# Patient Record
Sex: Male | Born: 1955 | Race: White | Hispanic: No | Marital: Married | Smoking: Current every day smoker
Health system: Southern US, Community
[De-identification: ages and names within clinical notes are randomized; demographics above are authoritative.]

## PROBLEM LIST (undated history)

## (undated) DIAGNOSIS — R918 Other nonspecific abnormal finding of lung field: Secondary | ICD-10-CM

## (undated) DIAGNOSIS — Z87442 Personal history of urinary calculi: Secondary | ICD-10-CM

## (undated) DIAGNOSIS — H269 Unspecified cataract: Secondary | ICD-10-CM

## (undated) HISTORY — DX: Other nonspecific abnormal finding of lung field: R91.8

## (undated) HISTORY — PX: WRIST SURGERY: SHX841

## (undated) HISTORY — DX: Unspecified cataract: H26.9

## (undated) HISTORY — PX: EYE SURGERY: SHX253

---

## 2021-01-08 ENCOUNTER — Ambulatory Visit (INDEPENDENT_AMBULATORY_CARE_PROVIDER_SITE_OTHER): Payer: Medicare Other | Admitting: Family Medicine

## 2021-01-08 ENCOUNTER — Other Ambulatory Visit: Payer: Self-pay

## 2021-01-08 ENCOUNTER — Encounter: Payer: Self-pay | Admitting: Family Medicine

## 2021-01-08 VITALS — BP 160/92 | HR 87 | Temp 99.1°F | Ht 68.0 in | Wt 176.0 lb

## 2021-01-08 DIAGNOSIS — R42 Dizziness and giddiness: Secondary | ICD-10-CM | POA: Diagnosis not present

## 2021-01-08 DIAGNOSIS — Z720 Tobacco use: Secondary | ICD-10-CM

## 2021-01-08 DIAGNOSIS — R911 Solitary pulmonary nodule: Secondary | ICD-10-CM | POA: Diagnosis not present

## 2021-01-08 DIAGNOSIS — I10 Essential (primary) hypertension: Secondary | ICD-10-CM | POA: Diagnosis not present

## 2021-01-08 DIAGNOSIS — N400 Enlarged prostate without lower urinary tract symptoms: Secondary | ICD-10-CM

## 2021-01-08 DIAGNOSIS — Z1211 Encounter for screening for malignant neoplasm of colon: Secondary | ICD-10-CM

## 2021-01-08 DIAGNOSIS — K219 Gastro-esophageal reflux disease without esophagitis: Secondary | ICD-10-CM

## 2021-01-08 DIAGNOSIS — K2289 Other specified disease of esophagus: Secondary | ICD-10-CM

## 2021-01-08 MED ORDER — OMEPRAZOLE 20 MG PO CPDR: 20.0000 mg | DELAYED_RELEASE_CAPSULE | Freq: Every day | ORAL | 3 refills | Status: DC

## 2021-01-08 MED ORDER — MECLIZINE HCL 25 MG PO TABS: 25.0000 mg | ORAL_TABLET | Freq: Three times a day (TID) | ORAL | 3 refills | Status: DC | PRN

## 2021-01-08 MED ORDER — LISINOPRIL 10 MG PO TABS: 10.0000 mg | ORAL_TABLET | Freq: Every day | ORAL | 3 refills | Status: DC

## 2021-01-08 NOTE — Progress Notes (Signed)
New Patient Office Visit  Assessment & Plan:  1. Essential hypertension Uncontrolled.  Started patient on lisinopril 10 mg once daily.  Education provided on the DASH diet. - lisinopril (ZESTRIL) 10 MG tablet; Take 1 tablet (10 mg total) by mouth daily.  Dispense: 30 tablet; Refill: 3 - CBC with Differential/Platelet - CMP14+EGFR - Lipid panel - TSH  2. Lung nodule Records requested from [facility] to review previous imaging that patient reports showed the lung nodule.  3. Tobacco use Encouraged smoking cessation. - CBC with Differential/Platelet - CMP14+EGFR - Lipid panel - CT CHEST LUNG CA SCREEN LOW DOSE W/O CM; Future  4. Dizziness Rx'd meclizine. - meclizine (ANTIVERT) 25 MG tablet; Take 1 tablet (25 mg total) by mouth 3 (three) times daily as needed for dizziness.  Dispense: 30 tablet; Refill: 3  5. Enlarged prostate - PR PSA, TOTAL SCREENING  6. Thickening of esophagus - Ambulatory referral to Gastroenterology  7. Gastroesophageal reflux disease, unspecified whether esophagitis present Started patient on Prilosec 20 mg once daily. - omeprazole (PRILOSEC) 20 MG capsule; Take 1 capsule (20 mg total) by mouth daily.  Dispense: 30 capsule; Refill: 3  8. Colon cancer screening - Ambulatory referral to Gastroenterology   Follow-up: Return in about 6 weeks (around 02/19/2021) for follow-up of chronic medication conditions.   [name], MSN, APRN, FNP-C [facility]  Subjective:  Patient ID: Richard Velez, male    DOB: 1956/09/15  Age: 65 y.o. MRN: [id number]  Patient Care Team: Loman Brooklyn, FNP as PCP - General (Family Medicine)  CC:  Chief Complaint  Patient presents with  . New Patient (Initial Visit)    No previous pcp  . Establish Care  . Ear Pain    Patient states that if he sleeping on his ear then he wakes up with ear pain.     HPI Richard Velez presents to establish care.   Patient is establishing care with  [place]. He reports he has not had a PCP and hasn't gone to the doctor in 30+ years. He is retired from [employer]. He is married.  His blood pressure today is elevated. He states he was told recently by another doctor that his blood pressure was elevated as well.   States he was told by someone at [facility] that he has pulmonary nodules; this was last year. He has had no follow-up of this.   He had a CT of the abdomen and pelvis on 10/04/2018 which revealed a kidney stone, enlarged prostate, and circumferential wall thickening of the distal esophagus. Consider endoscopy for further evaluation (reflux esophagitis, malignancy).  Patient does report he has acid reflux; both food and liquids get stuck in his throat at times.    Review of Systems  Constitutional: Negative for chills, fever, malaise/fatigue and weight loss.  HENT: Negative for congestion, ear discharge, ear pain, nosebleeds, sinus pain, sore throat and tinnitus.   Eyes: Negative for blurred vision, double vision, pain, discharge and redness.  Respiratory: Negative for cough, shortness of breath and wheezing.   Cardiovascular: Negative for chest pain, palpitations and leg swelling.  Gastrointestinal: Negative for abdominal pain, constipation, diarrhea, heartburn, nausea and vomiting.  Genitourinary: Negative for dysuria, frequency and urgency.  Musculoskeletal: Negative for myalgias.  Skin: Negative for rash.  Neurological: Positive for dizziness. Negative for seizures, weakness and headaches.  Psychiatric/Behavioral: Negative for depression, substance abuse and suicidal ideas. The patient is not nervous/anxious.     Current Outpatient Medications:  .  meclizine (ANTIVERT) 25 MG tablet, Take 25 mg by mouth 3 (three) times daily as needed for dizziness., Disp: , Rfl:   Allergies  Allergen Reactions  . Oxycodone-Acetaminophen Itching and Nausea Only    Past Medical History:  Diagnosis Date  . Cataract   . Lung nodules      Past Surgical History:  Procedure Laterality Date  . EYE SURGERY      Family History  Problem Relation Age of Onset  . Hypertension Mother   . Diabetes Mother   . Heart attack Father   . Diabetes Sister   . Diabetes Brother     Social History   Socioeconomic History  . Marital status: Married    Spouse name: Not on file  . Number of children: Not on file  . Years of education: Not on file  . Highest education level: Not on file  Occupational History  . Not on file  Tobacco Use  . Smoking status: Former Smoker    Packs/day: 1.00    Types: Cigarettes  . Smokeless tobacco: Never Used  Vaping Use  . Vaping Use: Never used  Substance and Sexual Activity  . Alcohol use: Yes    Comment: occ  . Drug use: Never  . Sexual activity: Not on file  Other Topics Concern  . Not on file  Social History Narrative  . Not on file   Social Determinants of Health   Financial Resource Strain: Not on file  Food Insecurity: Not on file  Transportation Needs: Not on file  Physical Activity: Not on file  Stress: Not on file  Social Connections: Not on file  Intimate Partner Violence: Not on file    Objective:   Today's Vitals: BP (!) 160/92   Pulse 87   Temp 99.1 F (37.3 C) (Temporal)   Ht $R'5\' 8"'Yq$  (1.727 m)   Wt 176 lb (79.8 kg)   SpO2 98%   BMI 26.76 kg/m   Physical Exam Vitals reviewed.  Constitutional:      General: He is not in acute distress.    Appearance: Normal appearance. He is not ill-appearing, toxic-appearing or diaphoretic.  HENT:     Head: Normocephalic and atraumatic.     Right Ear: Tympanic membrane, ear canal and external ear normal. There is no impacted cerumen.     Left Ear: Tympanic membrane, ear canal and external ear normal. There is no impacted cerumen.  Eyes:     General: No scleral icterus.       Right eye: No discharge.        Left eye: No discharge.     Conjunctiva/sclera: Conjunctivae normal.  Cardiovascular:     Rate and Rhythm:  Normal rate and regular rhythm.     Heart sounds: Normal heart sounds. No murmur heard. No friction rub. No gallop.   Pulmonary:     Effort: Pulmonary effort is normal. No respiratory distress.     Breath sounds: Normal breath sounds. No stridor. No wheezing, rhonchi or rales.  Musculoskeletal:        General: Normal range of motion.     Cervical back: Normal range of motion.  Skin:    General: Skin is warm and dry.  Neurological:     Mental Status: He is alert and oriented to person, place, and time. Mental status is at baseline.  Psychiatric:        Mood and Affect: Mood normal.        Behavior: Behavior normal.  Thought Content: Thought content normal.        Judgment: Judgment normal.

## 2021-01-08 NOTE — Patient Instructions (Signed)
DASH Eating Plan DASH stands for Dietary Approaches to Stop Hypertension. The DASH eating plan is a healthy eating plan that has been shown to:  Reduce high blood pressure (hypertension).  Reduce your risk for type 2 diabetes, heart disease, and stroke.  Help with weight loss. What are tips for following this plan? Reading food labels  Check food labels for the amount of salt (sodium) per serving. Choose foods with less than 5 percent of the Daily Value of sodium. Generally, foods with less than 300 milligrams (mg) of sodium per serving fit into this eating plan.  To find whole grains, look for the word "whole" as the first word in the ingredient list. Shopping  Buy products labeled as "low-sodium" or "no salt added."  Buy fresh foods. Avoid canned foods and pre-made or frozen meals. Cooking  Avoid adding salt when cooking. Use salt-free seasonings or herbs instead of table salt or sea salt. Check with your health care provider or pharmacist before using salt substitutes.  Do not fry foods. Cook foods using healthy methods such as baking, boiling, grilling, roasting, and broiling instead.  Cook with heart-healthy oils, such as olive, canola, avocado, soybean, or sunflower oil. Meal planning  Eat a balanced diet that includes: ? 4 or more servings of fruits and 4 or more servings of vegetables each day. Try to fill one-half of your plate with fruits and vegetables. ? 6-8 servings of whole grains each day. ? Less than 6 oz (170 g) of lean meat, poultry, or fish each day. A 3-oz (85-g) serving of meat is about the same size as a deck of cards. One egg equals 1 oz (28 g). ? 2-3 servings of low-fat dairy each day. One serving is 1 cup (237 mL). ? 1 serving of nuts, seeds, or beans 5 times each week. ? 2-3 servings of heart-healthy fats. Healthy fats called omega-3 fatty acids are found in foods such as walnuts, flaxseeds, fortified milks, and eggs. These fats are also found in  cold-water fish, such as sardines, salmon, and mackerel.  Limit how much you eat of: ? Canned or prepackaged foods. ? Food that is high in trans fat, such as some fried foods. ? Food that is high in saturated fat, such as fatty meat. ? Desserts and other sweets, sugary drinks, and other foods with added sugar. ? Full-fat dairy products.  Do not salt foods before eating.  Do not eat more than 4 egg yolks a week.  Try to eat at least 2 vegetarian meals a week.  Eat more home-cooked food and less restaurant, buffet, and fast food.   Lifestyle  When eating at a restaurant, ask that your food be prepared with less salt or no salt, if possible.  If you drink alcohol: ? Limit how much you use to:  0-1 drink a day for women who are not pregnant.  0-2 drinks a day for men. ? Be aware of how much alcohol is in your drink. In the [place] one drink equals one 12 oz bottle of beer (355 mL), one 5 oz glass of wine (148 mL), or one 1 oz glass of hard liquor (44 mL). General information  Avoid eating more than 2,300 mg of salt a day. If you have hypertension, you may need to reduce your sodium intake to 1,500 mg a day.  Work with your health care provider to maintain a healthy body weight or to lose weight. Ask what an ideal weight is for you.    Get at least 30 minutes of exercise that causes your heart to beat faster (aerobic exercise) most days of the week. Activities may include walking, swimming, or biking.  Work with your health care provider or dietitian to adjust your eating plan to your individual calorie needs. What foods should I eat? Fruits All fresh, dried, or frozen fruit. Canned fruit in natural juice (without added sugar). Vegetables Fresh or frozen vegetables (raw, steamed, roasted, or grilled). Low-sodium or reduced-sodium tomato and vegetable juice. Low-sodium or reduced-sodium tomato sauce and tomato paste. Low-sodium or reduced-sodium canned vegetables. Grains Whole-grain  or whole-wheat bread. Whole-grain or whole-wheat pasta. Brown rice. Oatmeal. Quinoa. Bulgur. Whole-grain and low-sodium cereals. Pita bread. Low-fat, low-sodium crackers. Whole-wheat flour tortillas. Meats and other proteins Skinless chicken or turkey. Ground chicken or turkey. Pork with fat trimmed off. Fish and seafood. Egg whites. Dried beans, peas, or lentils. Unsalted nuts, nut butters, and seeds. Unsalted canned beans. Lean cuts of beef with fat trimmed off. Low-sodium, lean precooked or cured meat, such as sausages or meat loaves. Dairy Low-fat (1%) or fat-free (skim) milk. Reduced-fat, low-fat, or fat-free cheeses. Nonfat, low-sodium ricotta or cottage cheese. Low-fat or nonfat yogurt. Low-fat, low-sodium cheese. Fats and oils Soft margarine without trans fats. Vegetable oil. Reduced-fat, low-fat, or light mayonnaise and salad dressings (reduced-sodium). Canola, safflower, olive, avocado, soybean, and sunflower oils. Avocado. Seasonings and condiments Herbs. Spices. Seasoning mixes without salt. Other foods Unsalted popcorn and pretzels. Fat-free sweets. The items listed above may not be a complete list of foods and beverages you can eat. Contact a dietitian for more information. What foods should I avoid? Fruits Canned fruit in a light or heavy syrup. Fried fruit. Fruit in cream or butter sauce. Vegetables Creamed or fried vegetables. Vegetables in a cheese sauce. Regular canned vegetables (not low-sodium or reduced-sodium). Regular canned tomato sauce and paste (not low-sodium or reduced-sodium). Regular tomato and vegetable juice (not low-sodium or reduced-sodium). Pickles. Olives. Grains Baked goods made with fat, such as croissants, muffins, or some breads. Dry pasta or rice meal packs. Meats and other proteins Fatty cuts of meat. Ribs. Fried meat. Bacon. Bologna, salami, and other precooked or cured meats, such as sausages or meat loaves. Fat from the back of a pig (fatback).  Bratwurst. Salted nuts and seeds. Canned beans with added salt. Canned or smoked fish. Whole eggs or egg yolks. Chicken or turkey with skin. Dairy Whole or 2% milk, cream, and half-and-half. Whole or full-fat cream cheese. Whole-fat or sweetened yogurt. Full-fat cheese. Nondairy creamers. Whipped toppings. Processed cheese and cheese spreads. Fats and oils Butter. Stick margarine. Lard. Shortening. Ghee. Bacon fat. Tropical oils, such as coconut, palm kernel, or palm oil. Seasonings and condiments Onion salt, garlic salt, seasoned salt, table salt, and sea salt. Worcestershire sauce. Tartar sauce. Barbecue sauce. Teriyaki sauce. Soy sauce, including reduced-sodium. Steak sauce. Canned and packaged gravies. Fish sauce. Oyster sauce. Cocktail sauce. Store-bought horseradish. Ketchup. Mustard. Meat flavorings and tenderizers. Bouillon cubes. Hot sauces. Pre-made or packaged marinades. Pre-made or packaged taco seasonings. Relishes. Regular salad dressings. Other foods Salted popcorn and pretzels. The items listed above may not be a complete list of foods and beverages you should avoid. Contact a dietitian for more information. Where to find more information  National Heart, Lung, and Blood Institute: www.nhlbi.nih.gov  American Heart Association: www.heart.org  Academy of Nutrition and Dietetics: www.eatright.org  National Kidney Foundation: www.kidney.org Summary  The DASH eating plan is a healthy eating plan that has been shown to reduce high   blood pressure (hypertension). It may also reduce your risk for type 2 diabetes, heart disease, and stroke.  When on the DASH eating plan, aim to eat more fresh fruits and vegetables, whole grains, lean proteins, low-fat dairy, and heart-healthy fats.  With the DASH eating plan, you should limit salt (sodium) intake to 2,300 mg a day. If you have hypertension, you may need to reduce your sodium intake to 1,500 mg a day.  Work with your health care  provider or dietitian to adjust your eating plan to your individual calorie needs. This information is not intended to replace advice given to you by your health care provider. Make sure you discuss any questions you have with your health care provider. Document Revised: 09/23/2019 Document Reviewed: 09/23/2019 Elsevier Patient Education  2021 Elsevier Inc.   

## 2021-01-09 LAB — CBC WITH DIFFERENTIAL/PLATELET
Basophils Absolute: 0.2 10*3/uL (ref 0.0–0.2)
Basos: 1 %
EOS (ABSOLUTE): 0.2 10*3/uL (ref 0.0–0.4)
Eos: 2 %
Hematocrit: 49.8 % (ref 37.5–51.0)
Hemoglobin: 17.6 g/dL (ref 13.0–17.7)
Immature Grans (Abs): 0 10*3/uL (ref 0.0–0.1)
Immature Granulocytes: 0 %
Lymphocytes Absolute: 2.9 10*3/uL (ref 0.7–3.1)
Lymphs: 25 %
MCH: 32.1 pg (ref 26.6–33.0)
MCHC: 35.3 g/dL (ref 31.5–35.7)
MCV: 91 fL (ref 79–97)
Monocytes Absolute: 0.9 10*3/uL (ref 0.1–0.9)
Monocytes: 8 %
Neutrophils Absolute: 7.4 10*3/uL — ABNORMAL HIGH (ref 1.4–7.0)
Neutrophils: 64 %
Platelets: 285 10*3/uL (ref 150–450)
RBC: 5.49 x10E6/uL (ref 4.14–5.80)
RDW: 12.5 % (ref 11.6–15.4)
WBC: 11.5 10*3/uL — ABNORMAL HIGH (ref 3.4–10.8)

## 2021-01-09 LAB — CMP14+EGFR
ALT: 36 IU/L (ref 0–44)
AST: 20 IU/L (ref 0–40)
Albumin/Globulin Ratio: 1.6 (ref 1.2–2.2)
Albumin: 4.2 g/dL (ref 3.8–4.8)
Alkaline Phosphatase: 140 IU/L — ABNORMAL HIGH (ref 44–121)
BUN/Creatinine Ratio: 20 (ref 10–24)
BUN: 19 mg/dL (ref 8–27)
Bilirubin Total: 0.4 mg/dL (ref 0.0–1.2)
CO2: 20 mmol/L (ref 20–29)
Calcium: 9.5 mg/dL (ref 8.6–10.2)
Chloride: 104 mmol/L (ref 96–106)
Creatinine, Ser: 0.94 mg/dL (ref 0.76–1.27)
Globulin, Total: 2.6 g/dL (ref 1.5–4.5)
Glucose: 114 mg/dL — ABNORMAL HIGH (ref 65–99)
Potassium: 4.3 mmol/L (ref 3.5–5.2)
Sodium: 140 mmol/L (ref 134–144)
Total Protein: 6.8 g/dL (ref 6.0–8.5)
eGFR: 90 mL/min/{1.73_m2} (ref >59)

## 2021-01-09 LAB — LIPID PANEL
Chol/HDL Ratio: 5.9 ratio — ABNORMAL HIGH (ref 0.0–5.0)
Cholesterol, Total: 217 mg/dL — ABNORMAL HIGH (ref 100–199)
HDL: 37 mg/dL — ABNORMAL LOW (ref >39)
LDL Chol Calc (NIH): 128 mg/dL — ABNORMAL HIGH (ref 0–99)
Triglycerides: 289 mg/dL — ABNORMAL HIGH (ref 0–149)
VLDL Cholesterol Cal: 52 mg/dL — ABNORMAL HIGH (ref 5–40)

## 2021-01-09 LAB — TSH: TSH: 1.34 u[IU]/mL (ref 0.450–4.500)

## 2021-01-10 ENCOUNTER — Encounter: Payer: Self-pay | Admitting: Family Medicine

## 2021-01-10 DIAGNOSIS — R1319 Other dysphagia: Secondary | ICD-10-CM | POA: Insufficient documentation

## 2021-01-10 DIAGNOSIS — Z720 Tobacco use: Secondary | ICD-10-CM | POA: Insufficient documentation

## 2021-01-10 DIAGNOSIS — N400 Enlarged prostate without lower urinary tract symptoms: Secondary | ICD-10-CM

## 2021-01-10 DIAGNOSIS — I10 Essential (primary) hypertension: Secondary | ICD-10-CM

## 2021-01-10 DIAGNOSIS — R911 Solitary pulmonary nodule: Secondary | ICD-10-CM | POA: Insufficient documentation

## 2021-01-10 DIAGNOSIS — R42 Dizziness and giddiness: Secondary | ICD-10-CM | POA: Insufficient documentation

## 2021-01-10 DIAGNOSIS — K219 Gastro-esophageal reflux disease without esophagitis: Secondary | ICD-10-CM

## 2021-01-10 DIAGNOSIS — K2289 Other specified disease of esophagus: Secondary | ICD-10-CM | POA: Insufficient documentation

## 2021-01-10 HISTORY — DX: Essential (primary) hypertension: I10

## 2021-01-10 HISTORY — DX: Benign prostatic hyperplasia without lower urinary tract symptoms: N40.0

## 2021-01-10 HISTORY — DX: Gastro-esophageal reflux disease without esophagitis: K21.9

## 2021-01-11 ENCOUNTER — Encounter (HOSPITAL_COMMUNITY): Payer: Self-pay

## 2021-01-11 ENCOUNTER — Encounter: Payer: Self-pay | Admitting: Family Medicine

## 2021-01-11 DIAGNOSIS — E782 Mixed hyperlipidemia: Secondary | ICD-10-CM

## 2021-01-11 HISTORY — DX: Mixed hyperlipidemia: E78.2

## 2021-01-11 NOTE — Progress Notes (Signed)
Received referral for initial lung cancer screening scan. Contacted patient and obtained smoking history (started age 65 smoking 1PPD, current smoker continuing to smoke 1PPD, 50 pack year) as well as answering questions related to the screening process. Patient denies signs/symptoms of lung cancer such as weight loss or hemoptysis. Patient denies comorbidity that would prevent curative treatment if lung cancer were to be found. Patient is scheduled for shared decision making visit on 01/17/2021 at 2:10pm

## 2021-01-15 ENCOUNTER — Encounter: Payer: Self-pay | Admitting: Nurse Practitioner

## 2021-01-15 ENCOUNTER — Telehealth: Payer: Self-pay

## 2021-01-15 ENCOUNTER — Other Ambulatory Visit: Payer: Self-pay

## 2021-01-15 ENCOUNTER — Ambulatory Visit (INDEPENDENT_AMBULATORY_CARE_PROVIDER_SITE_OTHER): Payer: Medicare Other | Admitting: Nurse Practitioner

## 2021-01-15 VITALS — BP 164/86 | HR 94 | Temp 98.0°F | Ht 68.0 in | Wt 174.2 lb

## 2021-01-15 DIAGNOSIS — R1310 Dysphagia, unspecified: Secondary | ICD-10-CM | POA: Insufficient documentation

## 2021-01-15 DIAGNOSIS — R1319 Other dysphagia: Secondary | ICD-10-CM

## 2021-01-15 DIAGNOSIS — R935 Abnormal findings on diagnostic imaging of other abdominal regions, including retroperitoneum: Secondary | ICD-10-CM | POA: Diagnosis not present

## 2021-01-15 DIAGNOSIS — Z0000 Encounter for general adult medical examination without abnormal findings: Secondary | ICD-10-CM | POA: Insufficient documentation

## 2021-01-15 DIAGNOSIS — K219 Gastro-esophageal reflux disease without esophagitis: Secondary | ICD-10-CM | POA: Diagnosis not present

## 2021-01-15 MED ORDER — CLENPIQ 10-3.5-12 MG-GM -GM/160ML PO SOLN: 1.0000 | Freq: Once | ORAL | 0 refills | Status: AC

## 2021-01-15 NOTE — Telephone Encounter (Signed)
PA for EGD/DIL submitted via [facility] website (no PA needed for TCS). PA# [id number], valid 02/27/21-05/28/21.

## 2021-01-15 NOTE — Patient Instructions (Addendum)
Your health issues we discussed today were:   GERD (reflux/heartburn) with dysphagia (swallowing difficulties): 1. Continue taking omeprazole (D acid blocker) once a day 2. We will plan for your upper endoscopy with possible dilation to evaluate the abnormal swallowing tube on your CT and to help with your swallowing problems 3. Further recommendations will follow your procedure  Need for colonoscopy: 1. We will complete your colonoscopy at the same time as your endoscopy 2. Further recommendations will follow your procedure  Elevated blood pressure: 1. Follow-up with primary care so they can make adjustments to your medications.  It is not unusual to have elevated blood pressure when you are just starting on blood pressure medications  Overall I recommend:  1. Continue your other current medications 2. Return for follow-up in 3 months 3. Call us for any questions or concerns   ---------------------------------------------------------------  At [facility] we value your feedback. You may receive a survey about your visit today. Please share your experience as we strive to create trusting relationships with our patients to provide genuine, compassionate, quality care.  We appreciate your understanding and patience as we review any laboratory studies, imaging, and other diagnostic tests that are ordered as we care for you. Our office policy is 5 business days for review of these results, and any emergent or urgent results are addressed in a timely manner for your best interest. If you do not hear from our office in 1 week, please contact us.   We also encourage the use of MyChart, which contains your medical information for your review as well. If you are not enrolled in this feature, an access code is on this after visit summary for your convenience. Thank you for allowing [place] to be involved in your care.  It was great to see you today!  I hope you have a great  spring!!    ---------------------------------------------------------------

## 2021-01-15 NOTE — Progress Notes (Signed)
Primary Care Physician:  Loman Brooklyn, FNP Primary Gastroenterologist:  Dr. Gala Romney  Chief Complaint  Patient presents with  . Consult    EGD for thickening of esophagus. C/o dysphagia with solid foods/liquids.  TCS done about 20 years ago    HPI:   Richard Velez is a 65 y.o. male who presents on referral from primary care for thickening of the esophagus and to schedule colonoscopy.  Reviewed information associated with the referral including office visit dated 01/08/2021 for multiple care concerns.  At that time noted lung nodule apparently from imaging at [facility] in the setting of tobacco use.  Recommend CT chest lung cancer screening low-dose.  Also noted thickening of the esophagus and GERD, currently on omeprazole 20 mg daily.  Also due for colon cancer screening.  Recommend referral to GI.  No history of colonoscopy or endoscopy was found in our system.  No chest or lung imaging available in our system.  No outside imaging records available in our system.  Reviewed CT of the abdomen and pelvis dated 10/04/2018.  Specifically noted circumferential wall thickening of the distal esophagus.  Also noted 4 mm nonobstructing right renal calculus without hydronephrosis.  Recommended endoscopy for further evaluation of these esophagus and to rule out malignancy.  Today he states he doing okay overall.  He has been having dysphagia with solid foods as well as liquids. Generally passes with time, rare to no regurgitation. Some mild GERD symptoms, on omeprazole which just started a week ago and not sure about effectiveness yet. Denies abdominal pain, N/V, hematochezia, melena, fever, chills, unintentional weight loss. Denies URI or flu-like symptoms. Denies loss of sense of taste or smell. The patient has not received COVID-19 vaccination(s). Denies chest pain, dyspnea, dizziness, lightheadedness, syncope, near syncope. Denies any other upper or lower GI symptoms.   He is also due for  colonoscopy as he states his last one was about 20 years ago in [place], [place] in an outpatient office, not sure of the name of the facility or endoscopist.  BP is a little elevated, just started lisinopril and will likely need titration.  Past Medical History:  Diagnosis Date  . Cataract   . Enlarged prostate 01/10/2021  . Essential hypertension 01/10/2021  . Gastroesophageal reflux disease 01/10/2021  . Lung nodules   . Mixed hyperlipidemia 01/11/2021    Past Surgical History:  Procedure Laterality Date  . EYE SURGERY      Current Outpatient Medications  Medication Sig Dispense Refill  . lisinopril (ZESTRIL) 10 MG tablet Take 1 tablet (10 mg total) by mouth daily. 30 tablet 3  . meclizine (ANTIVERT) 25 MG tablet Take 1 tablet (25 mg total) by mouth 3 (three) times daily as needed for dizziness. 30 tablet 3  . omeprazole (PRILOSEC) 20 MG capsule Take 1 capsule (20 mg total) by mouth daily. 30 capsule 3   No current facility-administered medications for this visit.    Allergies as of 01/15/2021 - Review Complete 01/15/2021  Allergen Reaction Noted  . Oxycodone-acetaminophen Itching and Nausea Only 12/17/2017    Family History  Problem Relation Age of Onset  . Hypertension Mother   . Diabetes Mother   . Heart attack Father   . Diabetes Sister   . Diabetes Brother   . Colon cancer Neg Hx   . Gastric cancer Neg Hx   . Esophageal cancer Neg Hx     Social History   Socioeconomic History  . Marital status: Married  Spouse name: Not on file  . Number of children: Not on file  . Years of education: Not on file  . Highest education level: Not on file  Occupational History  . Occupation: Retired    Comment: Delivered ice  Tobacco Use  . Smoking status: Current Every Day Smoker    Packs/day: 1.00    Types: Cigarettes    Start date: 01/11/1971  . Smokeless tobacco: Never Used  Vaping Use  . Vaping Use: Never used  Substance and Sexual Activity  . Alcohol use: Yes     Comment: occ  . Drug use: Never  . Sexual activity: Not on file  Other Topics Concern  . Not on file  Social History Narrative  . Not on file   Social Determinants of Health   Financial Resource Strain: Not on file  Food Insecurity: Not on file  Transportation Needs: Not on file  Physical Activity: Not on file  Stress: Not on file  Social Connections: Not on file  Intimate Partner Violence: Not on file    Subjective: Review of Systems  Constitutional: Negative for chills, fever, malaise/fatigue and weight loss.  HENT: Negative for congestion and sore throat.   Respiratory: Negative for cough and shortness of breath.   Cardiovascular: Negative for chest pain and palpitations.  Gastrointestinal: Positive for heartburn (just started PPI). Negative for abdominal pain, blood in stool, constipation, diarrhea, melena, nausea and vomiting.       Dysphagia  Musculoskeletal: Negative for joint pain and myalgias.  Skin: Negative for rash.  Neurological: Negative for dizziness and weakness.  Endo/Heme/Allergies: Does not bruise/bleed easily.  Psychiatric/Behavioral: Negative for depression. The patient is not nervous/anxious.   All other systems reviewed and are negative.      Objective: BP (!) 164/86   Pulse 94   Temp 98 F (36.7 C)   Ht 5\' 8"  (1.727 m)   Wt 174 lb 3.2 oz (79 kg)   BMI 26.49 kg/m  Physical Exam Vitals and nursing note reviewed.  Constitutional:      General: He is not in acute distress.    Appearance: Normal appearance. He is normal weight. He is not ill-appearing, toxic-appearing or diaphoretic.  HENT:     Head: Normocephalic and atraumatic.     Nose: No congestion or rhinorrhea.  Eyes:     General: No scleral icterus. Cardiovascular:     Rate and Rhythm: Normal rate and regular rhythm.     Heart sounds: Normal heart sounds.  Pulmonary:     Effort: Pulmonary effort is normal.     Breath sounds: Normal breath sounds.  Abdominal:     General:  Bowel sounds are normal. There is no distension.     Palpations: Abdomen is soft. There is no hepatomegaly, splenomegaly or mass.     Tenderness: There is no abdominal tenderness. There is no guarding or rebound.     Hernia: No hernia is present.  Musculoskeletal:     Cervical back: Neck supple.  Skin:    General: Skin is warm and dry.     Coloration: Skin is not jaundiced.     Findings: No bruising or rash.  Neurological:     General: No focal deficit present.     Mental Status: He is alert and oriented to person, place, and time. Mental status is at baseline.  Psychiatric:        Mood and Affect: Mood normal.        Behavior: Behavior normal.  Thought Content: Thought content normal.      Assessment:  Very pleasant 65 year old male presents on referral from primary care to schedule colonoscopy and due to previous CT imaging documented circumferential wall thickening of the distal esophagus.  Reviewed CT completed in 2019 in care everywhere, as outlined in HPI.  Recommended endoscopic evaluation.  He has had what seems to be longstanding GERD, to started on a PPI recently as he has just obtained insurance through [employer].  Could be simple untreated reflux esophagitis, although cannot rule out more insidious pathology.  We will plan for colonoscopy as well as EGD with possible dilation due to dysphagia symptoms.  His last colonoscopy was 20 years ago.  Further recommendations to follow.   Proceed with colonoscopy and EGD +/- dilation by Dr. Gala Romney in near future: the risks, benefits, and alternatives have been discussed with the patient in detail. The patient states understanding and desires to proceed.  ASA II  The patient is not on any other anticoagulants, anxiolytics, chronic pain medications, antidepressants, antidiabetics, or iron supplements.   Plan: 1. Continue current medications 2. Follow-up with primary care for blood pressure management 3. Colonoscopy and EGD as  outlined above 4. Follow-up in 3 months    Thank you for allowing [place] to participate in the care of Dorthula Perfect, DNP, AGNP-C Adult & Gerontological Nurse Practitioner [facility]   01/15/2021 2:24 PM   Disclaimer: This note was dictated with voice recognition software. Similar sounding words can inadvertently be transcribed and may not be corrected upon review.

## 2021-01-16 ENCOUNTER — Other Ambulatory Visit: Payer: Self-pay | Admitting: Family Medicine

## 2021-01-16 DIAGNOSIS — E782 Mixed hyperlipidemia: Secondary | ICD-10-CM

## 2021-01-16 MED ORDER — ATORVASTATIN CALCIUM 10 MG PO TABS: 10.0000 mg | ORAL_TABLET | Freq: Every day | ORAL | 2 refills | Status: DC

## 2021-01-17 ENCOUNTER — Other Ambulatory Visit: Payer: Self-pay

## 2021-01-17 ENCOUNTER — Inpatient Hospital Stay (HOSPITAL_COMMUNITY): Payer: Medicare Other | Attending: Oncology | Admitting: Oncology

## 2021-01-17 DIAGNOSIS — F17210 Nicotine dependence, cigarettes, uncomplicated: Secondary | ICD-10-CM

## 2021-01-17 DIAGNOSIS — Z87891 Personal history of nicotine dependence: Secondary | ICD-10-CM

## 2021-01-17 NOTE — Progress Notes (Signed)
Virtual Visit via Video Note  I connected with Richard Velez on 01/17/21 at  2:10 PM EDT by a video enabled telemedicine application and verified that I am speaking with the correct person using two identifiers.  Location: Patient: Home Provider: Clinic    I discussed the limitations of evaluation and management by telemedicine and the availability of in person appointments. The patient expressed understanding and agreed to proceed.  I discussed the assessment and treatment plan with the patient. The patient was provided an opportunity to ask questions and all were answered. The patient agreed with the plan and demonstrated an understanding of the instructions.   The patient was advised to call back or seek an in-person evaluation if the symptoms worsen or if the condition fails to improve as anticipated.   In accordance with CMS guidelines, patient has met eligibility criteria including age, absence of signs or symptoms of lung cancer.  Social History   Tobacco Use  . Smoking status: Current Every Day Smoker    Packs/day: 1.00    Types: Cigarettes    Start date: 01/11/1971  . Smokeless tobacco: Never Used  Vaping Use  . Vaping Use: Never used  Substance Use Topics  . Alcohol use: Yes    Comment: occ  . Drug use: Never      A shared decision-making session was conducted prior to the performance of CT scan. This includes one or more decision aids, includes benefits and harms of screening, follow-up diagnostic testing, over-diagnosis, false positive rate, and total radiation exposure.   Counseling on the importance of adherence to annual lung cancer LDCT screening, impact of co-morbidities, and ability or willingness to undergo diagnosis and treatment is imperative for compliance of the program.   Counseling on the importance of continued smoking cessation for former smokers; the importance of smoking cessation for current smokers, and information about tobacco cessation interventions  have been given to patient including [place] [facility] and 1800 quit [place] programs.   Written order for lung cancer screening with LDCT has been given to the patient and any and all questions have been answered to the best of my abilities.    Yearly follow up will be coordinated by Shawn Perkins, Thoracic Navigator.  I provided 15 minutes of non face-to-face telephone visit time during this encounter, and > 50% was spent counseling as documented under my assessment & plan.   Jennifer E Burns, NP  

## 2021-01-21 ENCOUNTER — Other Ambulatory Visit (HOSPITAL_COMMUNITY): Payer: Self-pay

## 2021-01-21 DIAGNOSIS — Z87891 Personal history of nicotine dependence: Secondary | ICD-10-CM

## 2021-01-21 DIAGNOSIS — Z122 Encounter for screening for malignant neoplasm of respiratory organs: Secondary | ICD-10-CM

## 2021-01-21 NOTE — Progress Notes (Signed)
Patient scheduled for LDCT on 02/19/2021 at 2p

## 2021-02-11 ENCOUNTER — Encounter: Payer: Self-pay | Admitting: Internal Medicine

## 2021-02-19 ENCOUNTER — Ambulatory Visit (HOSPITAL_COMMUNITY)
Admission: RE | Admit: 2021-02-19 | Discharge: 2021-02-19 | Disposition: A | Discharge status: Home / Self Care | Payer: Medicare Other | Source: Ambulatory Visit | Attending: Oncology | Admitting: Oncology

## 2021-02-19 ENCOUNTER — Other Ambulatory Visit: Payer: Self-pay

## 2021-02-19 DIAGNOSIS — Z122 Encounter for screening for malignant neoplasm of respiratory organs: Secondary | ICD-10-CM | POA: Diagnosis present

## 2021-02-19 DIAGNOSIS — I700 Atherosclerosis of aorta: Secondary | ICD-10-CM

## 2021-02-19 DIAGNOSIS — J449 Chronic obstructive pulmonary disease, unspecified: Secondary | ICD-10-CM | POA: Insufficient documentation

## 2021-02-19 DIAGNOSIS — Z87891 Personal history of nicotine dependence: Secondary | ICD-10-CM

## 2021-02-19 DIAGNOSIS — J439 Emphysema, unspecified: Secondary | ICD-10-CM

## 2021-02-19 HISTORY — DX: Atherosclerosis of aorta: I70.0

## 2021-02-19 HISTORY — DX: Emphysema, unspecified: J43.9

## 2021-02-21 ENCOUNTER — Ambulatory Visit (INDEPENDENT_AMBULATORY_CARE_PROVIDER_SITE_OTHER): Payer: Medicare Other | Admitting: Family Medicine

## 2021-02-21 ENCOUNTER — Other Ambulatory Visit: Payer: Self-pay

## 2021-02-21 ENCOUNTER — Encounter: Payer: Self-pay | Admitting: Family Medicine

## 2021-02-21 VITALS — BP 143/77 | HR 83 | Ht 68.0 in | Wt 174.0 lb

## 2021-02-21 DIAGNOSIS — K130 Diseases of lips: Secondary | ICD-10-CM

## 2021-02-21 DIAGNOSIS — I700 Atherosclerosis of aorta: Secondary | ICD-10-CM

## 2021-02-21 DIAGNOSIS — Z23 Encounter for immunization: Secondary | ICD-10-CM

## 2021-02-21 DIAGNOSIS — J439 Emphysema, unspecified: Secondary | ICD-10-CM | POA: Diagnosis not present

## 2021-02-21 DIAGNOSIS — I10 Essential (primary) hypertension: Secondary | ICD-10-CM | POA: Diagnosis not present

## 2021-02-21 DIAGNOSIS — R911 Solitary pulmonary nodule: Secondary | ICD-10-CM

## 2021-02-21 MED ORDER — COZAAR 50 MG PO TABS: 50.0000 mg | ORAL_TABLET | Freq: Every day | ORAL | 2 refills | Status: DC

## 2021-02-21 MED ORDER — ALBUTEROL SULFATE HFA 108 (90 BASE) MCG/ACT IN AERS: 2.0000 | INHALATION_SPRAY | Freq: Four times a day (QID) | RESPIRATORY_TRACT | 2 refills | Status: DC | PRN

## 2021-02-21 NOTE — Progress Notes (Signed)
Assessment & Plan:  1. Essential hypertension Well controlled on current regimen, but patient developed a cough after starting the lisinopril.  It was discontinued and added to his allergy list.  Rx'd losartan 50 mg once daily. - COZAAR 50 MG tablet; Take 1 tablet (50 mg total) by mouth daily.  Dispense: 30 tablet; Refill: 2  2. Pulmonary emphysema, unspecified emphysema type ([place]) New diagnosis.  Rx'd albuterol inhaler and advised patient he may use if he is feeling short of breath, tight in the chest, or wheezing. - albuterol (VENTOLIN HFA) 108 (90 Base) MCG/ACT inhaler; Inhale 2 puffs into the lungs every 6 (six) hours as needed.  Dispense: 18 g; Refill: 2  3. Aortic atherosclerosis ([place]) New diagnosis.  Patient is on a statin.  4. Lung nodule Benign on CT scan.  Continue yearly low-dose lung cancer screening CTs.  5. Lip lesion Offered a referral to dermatology, but patient does not wish to do the that at this time.  6. Need for Tdap vaccination - Tdap vaccine greater than or equal to 7yo IM - given in office.    Return in about 6 weeks (around 04/04/2021) for HTN.  [name], MSN, APRN, FNP-C [facility]  Subjective:    Patient ID: Richard Velez, male    DOB: 05-11-1956, 65 y.o.   MRN: [id number]  Patient Care Team: Loman Brooklyn, FNP as PCP - General (Family Medicine) Gala Romney Cristopher Estimable, MD as Consulting Physician (Gastroenterology)   Chief Complaint:  Chief Complaint  Patient presents with  . Hypertension    HPI: Richard Velez is a 65 y.o. male presenting on 02/21/2021 for Hypertension  Patient is following up on hypertension.  He was started on lisinopril at our last visit.  He reports it is causing him to have a dry hacking cough.  Patient is scheduled to have a colonoscopy on 02/27/2021.  Patient had his first low-dose lung cancer screening CT on 02/19/2021.  He does have a few small lung nodules that are benign in appearance.  The  scan also identified emphysema and aortic atherosclerosis.  Recommended to repeat in 12 months.  New complaints: Patient reports a spot on his lower lip.  He states he can scratch it off but that it will always come back.  Social history:  Relevant past medical, surgical, family and social history reviewed and updated as indicated. Interim medical history since our last visit reviewed.  Allergies and medications reviewed and updated.  DATA REVIEWED: CHART IN EPIC  ROS: Negative unless specifically indicated above in HPI.    Current Outpatient Medications:  .  atorvastatin (LIPITOR) 10 MG tablet, Take 1 tablet (10 mg total) by mouth daily., Disp: 30 tablet, Rfl: 2 .  lisinopril (ZESTRIL) 10 MG tablet, Take 1 tablet (10 mg total) by mouth daily., Disp: 30 tablet, Rfl: 3 .  meclizine (ANTIVERT) 25 MG tablet, Take 1 tablet (25 mg total) by mouth 3 (three) times daily as needed for dizziness., Disp: 30 tablet, Rfl: 3 .  omeprazole (PRILOSEC) 20 MG capsule, Take 1 capsule (20 mg total) by mouth daily., Disp: 30 capsule, Rfl: 3   Allergies  Allergen Reactions  . Oxycodone-Acetaminophen Itching and Nausea Only   Past Medical History:  Diagnosis Date  . Cataract   . Enlarged prostate 01/10/2021  . Essential hypertension 01/10/2021  . Gastroesophageal reflux disease 01/10/2021  . Lung nodules   . Mixed hyperlipidemia 01/11/2021    Past Surgical History:  Procedure Laterality Date  .  EYE SURGERY      Social History   Socioeconomic History  . Marital status: Married    Spouse name: Not on file  . Number of children: Not on file  . Years of education: Not on file  . Highest education level: Not on file  Occupational History  . Occupation: Retired    Comment: Delivered ice  Tobacco Use  . Smoking status: Current Every Day Smoker    Packs/day: 1.00    Types: Cigarettes    Start date: 01/11/1971  . Smokeless tobacco: Never Used  Vaping Use  . Vaping Use: Never used  Substance  and Sexual Activity  . Alcohol use: Yes    Comment: occ  . Drug use: Never  . Sexual activity: Not on file  Other Topics Concern  . Not on file  Social History Narrative  . Not on file   Social Determinants of Health   Financial Resource Strain: Not on file  Food Insecurity: Not on file  Transportation Needs: Not on file  Physical Activity: Not on file  Stress: Not on file  Social Connections: Not on file  Intimate Partner Violence: Not on file        Objective:    BP (!) 143/77   Pulse 83   Ht 5\' 8"  (1.727 m)   Wt 174 lb (78.9 kg)   SpO2 97%   BMI 26.46 kg/m   Wt Readings from Last 3 Encounters:  02/21/21 174 lb (78.9 kg)  01/15/21 174 lb 3.2 oz (79 kg)  01/08/21 176 lb (79.8 kg)    Physical Exam Vitals reviewed.  Constitutional:      General: He is not in acute distress.    Appearance: Normal appearance. He is not ill-appearing, toxic-appearing or diaphoretic.  HENT:     Head: Normocephalic and atraumatic.     Mouth/Throat:     Lips: Lesions (white on lower lip) present.  Eyes:     General: No scleral icterus.       Right eye: No discharge.        Left eye: No discharge.     Conjunctiva/sclera: Conjunctivae normal.  Cardiovascular:     Rate and Rhythm: Normal rate and regular rhythm.     Heart sounds: Normal heart sounds. No murmur heard. No friction rub. No gallop.   Pulmonary:     Effort: Pulmonary effort is normal. No respiratory distress.     Breath sounds: Normal breath sounds. No stridor. No wheezing, rhonchi or rales.  Musculoskeletal:        General: Normal range of motion.     Cervical back: Normal range of motion.  Skin:    General: Skin is warm and dry.  Neurological:     Mental Status: He is alert and oriented to person, place, and time. Mental status is at baseline.  Psychiatric:        Mood and Affect: Mood normal.        Behavior: Behavior normal.        Thought Content: Thought content normal.        Judgment: Judgment normal.      Lab Results  Component Value Date   TSH 1.340 01/08/2021   Lab Results  Component Value Date   WBC 11.5 (H) 01/08/2021   HGB 17.6 01/08/2021   HCT 49.8 01/08/2021   MCV 91 01/08/2021   PLT 285 01/08/2021   Lab Results  Component Value Date   NA 140 01/08/2021   K  4.3 01/08/2021   CO2 20 01/08/2021   GLUCOSE 114 (H) 01/08/2021   BUN 19 01/08/2021   CREATININE 0.94 01/08/2021   BILITOT 0.4 01/08/2021   ALKPHOS 140 (H) 01/08/2021   AST 20 01/08/2021   ALT 36 01/08/2021   PROT 6.8 01/08/2021   ALBUMIN 4.2 01/08/2021   CALCIUM 9.5 01/08/2021   Lab Results  Component Value Date   CHOL 217 (H) 01/08/2021   Lab Results  Component Value Date   HDL 37 (L) 01/08/2021   Lab Results  Component Value Date   LDLCALC 128 (H) 01/08/2021   Lab Results  Component Value Date   TRIG 289 (H) 01/08/2021   Lab Results  Component Value Date   CHOLHDL 5.9 (H) 01/08/2021   No results found for: HGBA1C

## 2021-02-24 ENCOUNTER — Encounter: Payer: Self-pay | Admitting: Family Medicine

## 2021-02-24 DIAGNOSIS — K130 Diseases of lips: Secondary | ICD-10-CM | POA: Insufficient documentation

## 2021-02-25 ENCOUNTER — Other Ambulatory Visit: Payer: Self-pay

## 2021-02-25 ENCOUNTER — Other Ambulatory Visit (HOSPITAL_COMMUNITY)
Admission: RE | Admit: 2021-02-25 | Discharge: 2021-02-25 | Disposition: A | Discharge status: Home / Self Care | Payer: Medicare Other | Source: Ambulatory Visit | Attending: Internal Medicine | Admitting: Internal Medicine

## 2021-02-25 DIAGNOSIS — Z01812 Encounter for preprocedural laboratory examination: Secondary | ICD-10-CM | POA: Diagnosis present

## 2021-02-25 DIAGNOSIS — Z20822 Contact with and (suspected) exposure to covid-19: Secondary | ICD-10-CM | POA: Insufficient documentation

## 2021-02-25 LAB — SARS CORONAVIRUS 2 (TAT 6-24 HRS): SARS Coronavirus 2: NEGATIVE

## 2021-02-26 ENCOUNTER — Encounter (HOSPITAL_COMMUNITY): Payer: Self-pay

## 2021-02-26 NOTE — Progress Notes (Signed)
Patient notified of LDCT Lung Cancer Screening Results via mail with the recommendation to follow-up in 12 months. Patient's referring provider has been sent a copy of results. Results are as follows:  IMPRESSION: 1. Lung-RADS 2, benign appearance or behavior. Continue annual screening with low-dose chest CT without contrast in 12 months. 2. Emphysema and aortic atherosclerosis.

## 2021-02-27 ENCOUNTER — Encounter (HOSPITAL_COMMUNITY): Payer: Self-pay | Admitting: Internal Medicine

## 2021-02-27 ENCOUNTER — Encounter (HOSPITAL_COMMUNITY)
Admission: RE | Disposition: A | Discharge status: Home / Self Care | Payer: Self-pay | Source: Home / Self Care | Attending: Internal Medicine

## 2021-02-27 ENCOUNTER — Other Ambulatory Visit: Payer: Self-pay

## 2021-02-27 ENCOUNTER — Ambulatory Visit (HOSPITAL_COMMUNITY)
Admission: RE | Admit: 2021-02-27 | Discharge: 2021-02-27 | Disposition: A | Discharge status: Home / Self Care | Payer: Medicare Other | Attending: Internal Medicine | Admitting: Internal Medicine

## 2021-02-27 DIAGNOSIS — Z79899 Other long term (current) drug therapy: Secondary | ICD-10-CM | POA: Diagnosis not present

## 2021-02-27 DIAGNOSIS — Z1211 Encounter for screening for malignant neoplasm of colon: Secondary | ICD-10-CM

## 2021-02-27 DIAGNOSIS — K635 Polyp of colon: Secondary | ICD-10-CM

## 2021-02-27 DIAGNOSIS — F17210 Nicotine dependence, cigarettes, uncomplicated: Secondary | ICD-10-CM | POA: Diagnosis not present

## 2021-02-27 DIAGNOSIS — D120 Benign neoplasm of cecum: Secondary | ICD-10-CM | POA: Diagnosis not present

## 2021-02-27 DIAGNOSIS — K219 Gastro-esophageal reflux disease without esophagitis: Secondary | ICD-10-CM | POA: Insufficient documentation

## 2021-02-27 DIAGNOSIS — K2950 Unspecified chronic gastritis without bleeding: Secondary | ICD-10-CM | POA: Diagnosis not present

## 2021-02-27 DIAGNOSIS — K222 Esophageal obstruction: Secondary | ICD-10-CM | POA: Diagnosis not present

## 2021-02-27 DIAGNOSIS — K449 Diaphragmatic hernia without obstruction or gangrene: Secondary | ICD-10-CM | POA: Diagnosis not present

## 2021-02-27 DIAGNOSIS — Z885 Allergy status to narcotic agent status: Secondary | ICD-10-CM | POA: Insufficient documentation

## 2021-02-27 DIAGNOSIS — R933 Abnormal findings on diagnostic imaging of other parts of digestive tract: Secondary | ICD-10-CM | POA: Diagnosis not present

## 2021-02-27 DIAGNOSIS — R1310 Dysphagia, unspecified: Secondary | ICD-10-CM

## 2021-02-27 HISTORY — PX: MALONEY DILATION: SHX5535

## 2021-02-27 HISTORY — PX: ESOPHAGOGASTRODUODENOSCOPY: SHX5428

## 2021-02-27 HISTORY — DX: Personal history of urinary calculi: Z87.442

## 2021-02-27 HISTORY — PX: POLYPECTOMY: SHX5525

## 2021-02-27 HISTORY — PX: COLONOSCOPY: SHX5424

## 2021-02-27 HISTORY — PX: BIOPSY: SHX5522

## 2021-02-27 SURGERY — COLONOSCOPY
Anesthesia: Moderate Sedation

## 2021-02-27 MED ORDER — ONDANSETRON HCL 4 MG/2ML IJ SOLN
INTRAMUSCULAR | Status: AC
  Filled 2021-02-27: qty 2

## 2021-02-27 MED ORDER — MEPERIDINE HCL 100 MG/ML IJ SOLN
INTRAMUSCULAR | Status: DC | PRN
  Administered 2021-02-27: 15 mg via INTRAVENOUS
  Administered 2021-02-27: 25 mg via INTRAVENOUS
  Administered 2021-02-27: 10 mg via INTRAVENOUS

## 2021-02-27 MED ORDER — MIDAZOLAM HCL 5 MG/5ML IJ SOLN
INTRAMUSCULAR | Status: DC | PRN
  Administered 2021-02-27: 1 mg via INTRAVENOUS
  Administered 2021-02-27 (×2): 2 mg via INTRAVENOUS
  Administered 2021-02-27: 1 mg via INTRAVENOUS
  Administered 2021-02-27: 2 mg via INTRAVENOUS
  Administered 2021-02-27 (×2): 1 mg via INTRAVENOUS

## 2021-02-27 MED ORDER — LIDOCAINE VISCOUS HCL 2 % MT SOLN
OROMUCOSAL | Status: DC | PRN
  Administered 2021-02-27: 5 mL via OROMUCOSAL

## 2021-02-27 MED ORDER — STERILE WATER FOR IRRIGATION IR SOLN: Status: DC | PRN

## 2021-02-27 MED ORDER — MEPERIDINE HCL 50 MG/ML IJ SOLN
INTRAMUSCULAR | Status: AC
  Filled 2021-02-27: qty 1

## 2021-02-27 MED ORDER — LIDOCAINE VISCOUS HCL 2 % MT SOLN
OROMUCOSAL | Status: AC
  Filled 2021-02-27: qty 15

## 2021-02-27 MED ORDER — SODIUM CHLORIDE 0.9 % IV SOLN
INTRAVENOUS | Status: DC
  Administered 2021-02-27: 1000 mL via INTRAVENOUS

## 2021-02-27 MED ORDER — ONDANSETRON HCL 4 MG/2ML IJ SOLN
INTRAMUSCULAR | Status: DC | PRN
  Administered 2021-02-27: 4 mg via INTRAVENOUS

## 2021-02-27 MED ORDER — MIDAZOLAM HCL 5 MG/5ML IJ SOLN
INTRAMUSCULAR | Status: AC
  Filled 2021-02-27: qty 10

## 2021-02-27 NOTE — H&P (Signed)
@LOGO @   Primary Care Physician:  Loman Brooklyn, FNP Primary Gastroenterologist:  Dr. Gala Romney  Pre-Procedure History & Physical: HPI:  Richard Velez is a 65 y.o. male here for further evaluation of esophageal dysphagia in the setting of longstanding GERD.  Abnormal distal esophagus on CT screening chest CT. Only recently started on a PPI.  Overdue for average rescreening colonoscopy as well. Past Medical History:  Diagnosis Date  . Aortic atherosclerosis ([place]) 02/19/2021  . Cataract   . Emphysema lung ([place]) 02/19/2021  . Enlarged prostate 01/10/2021  . Essential hypertension 01/10/2021  . Gastroesophageal reflux disease 01/10/2021  . History of kidney stones   . Lung nodules    Benign in appearance on low-dose lung cancer screening CT on 02/19/2021  . Mixed hyperlipidemia 01/11/2021    Past Surgical History:  Procedure Laterality Date  . EYE SURGERY Bilateral    cataract    Prior to Admission medications   Medication Sig Start Date End Date Taking? Authorizing Provider  atorvastatin (LIPITOR) 10 MG tablet Take 1 tablet (10 mg total) by mouth daily. 01/16/21  Yes Loman Brooklyn, FNP  COZAAR 50 MG tablet Take 1 tablet (50 mg total) by mouth daily. 02/21/21  Yes [name] F, FNP  meclizine (ANTIVERT) 25 MG tablet Take 1 tablet (25 mg total) by mouth 3 (three) times daily as needed for dizziness. 01/08/21  Yes Loman Brooklyn, FNP  omeprazole (PRILOSEC) 20 MG capsule Take 1 capsule (20 mg total) by mouth daily. 01/08/21  Yes [name] F, FNP  albuterol (VENTOLIN HFA) 108 (90 Base) MCG/ACT inhaler Inhale 2 puffs into the lungs every 6 (six) hours as needed. 02/21/21   Loman Brooklyn, FNP    Allergies as of 01/15/2021 - Review Complete 01/15/2021  Allergen Reaction Noted  . Oxycodone-acetaminophen Itching and Nausea Only 12/17/2017    Family History  Problem Relation Age of Onset  . Hypertension Mother   . Diabetes Mother   . Heart attack Father   . Diabetes Sister   .  Diabetes Brother   . Colon cancer Neg Hx   . Gastric cancer Neg Hx   . Esophageal cancer Neg Hx     Social History   Socioeconomic History  . Marital status: Married    Spouse name: Not on file  . Number of children: Not on file  . Years of education: Not on file  . Highest education level: Not on file  Occupational History  . Occupation: Retired    Comment: Delivered ice  Tobacco Use  . Smoking status: Current Every Day Smoker    Packs/day: 1.00    Types: Cigarettes    Start date: 01/11/1971  . Smokeless tobacco: Never Used  Vaping Use  . Vaping Use: Never used  Substance and Sexual Activity  . Alcohol use: Yes    Comment: occ  . Drug use: Never  . Sexual activity: Not on file  Other Topics Concern  . Not on file  Social History Narrative  . Not on file   Social Determinants of Health   Financial Resource Strain: Not on file  Food Insecurity: Not on file  Transportation Needs: Not on file  Physical Activity: Not on file  Stress: Not on file  Social Connections: Not on file  Intimate Partner Violence: Not on file    Review of Systems: See HPI, otherwise negative ROS  Physical Exam: BP (!) 164/89   Pulse 94   Temp 98.2 F (36.8 C) (Oral)  Resp 15   Ht 5\' 8"  (1.727 m)   Wt 78.9 kg   SpO2 98%   BMI 26.46 kg/m  General:   Alert,  Well-developed, well-nourished, pleasant and cooperative in NAD Neck:  Supple; no masses or thyromegaly. No significant cervical adenopathy. Lungs:  Clear throughout to auscultation.   No wheezes, crackles, or rhonchi. No acute distress. Heart:  Regular rate and rhythm; no murmurs, clicks, rubs,  or gallops. Abdomen: Non-distended, normal bowel sounds.  Soft and nontender without appreciable mass or hepatosplenomegaly.  Pulses:  Normal pulses noted. Extremities:  Without clubbing or edema.  Impression/Plan: 65 year old gentleman with a longstanding GERD and esophageal dysphagia in setting of abnormal esophagus on  cross-sectional imaging.  Here for EGD with further evaluation.  Also here for a screening colonoscopy-average risk.  The risks, benefits, limitations, imponderables and alternatives regarding both EGD and colonoscopy have been reviewed with the patient. Questions have been answered. All parties agreeable.      Notice: This dictation was prepared with Dragon dictation along with smaller phrase technology. Any transcriptional errors that result from this process are unintentional and may not be corrected upon review.

## 2021-02-27 NOTE — Op Note (Signed)
[facility] Patient Name: Richard Velez Procedure Date: 02/27/2021 10:43 AM MRN: [id number] Date of Birth: 1956/03/25 Attending MD: Norvel Richards , MD CSN: [id number] Age: 64 Admit Type: Outpatient Procedure:                Upper GI endoscopy Indications:              Dysphagia, Abnormal CT of the GI tract Providers:                Norvel Richards, MD, Otis Peak B. Sharon Seller, RN,                            Raphael Gibney, Technician Referring MD:              Medicines:                Midazolam 6 mg IV, Meperidine 50 mg IV Complications:            No immediate complications. Estimated Blood Loss:     Estimated blood loss was minimal. Procedure:                Pre-Anesthesia Assessment:                           - Prior to the procedure, a History and Physical                            was performed, and patient medications and                            allergies were reviewed. The patient's tolerance of                            previous anesthesia was also reviewed. The risks                            and benefits of the procedure and the sedation                            options and risks were discussed with the patient.                            All questions were answered, and informed consent                            was obtained. Prior Anticoagulants: The patient has                            taken no previous anticoagulant or antiplatelet                            agents. ASA Grade Assessment: III - A patient with                            severe systemic disease. After reviewing the risks  and benefits, the patient was deemed in                            satisfactory condition to undergo the procedure.                           After obtaining informed consent, the endoscope was                            passed under direct vision. Throughout the                            procedure, the patient's blood pressure, pulse, and                             oxygen saturations were monitored continuously. The                            [id number]-[id number]) scope was introduced through the                            mouth, and advanced to the second part of duodenum.                            The upper GI endoscopy was accomplished without                            difficulty. The patient tolerated the procedure                            well. Scope In: 11:10:36 AM Scope Out: 11:22:30 AM Total Procedure Duration: 0 hours 11 minutes 54 seconds  Findings:      Critical narrowing of the GE junction. Friable mucosa circumferentially       within 2 cm of the GE junction. No obvious tumor. Some denuded appearing       mucosa. Did not see any Barrett's epithelium.      A medium-sized hiatal hernia was present. Scattered gastric erosions. No       ulcer or infiltrating process observed. Patent pylorus.      Bulbar erosions present; otherwise the first and second portion of the       duodenum appeared normal. The scope was withdrawn. Dilation was       performed with a Maloney dilator with moderate resistance at 54 Fr. The       dilation site was examined following endoscope reinsertion and showed       mild mucosal disruption. Estimated blood loss was minimal. Finally,       biopsies of the abnormal GE junction and gastric mucosa were taken       separately. Impression:               - Distal esophageal stenosis/stricture. Dilated and                            biopsied.                           -  Medium-sized hiatal hernia. Gastric and bulbar                            erosions?"status post gastric biopsy                           - . Moderate Sedation:      Moderate (conscious) sedation was administered by the endoscopy nurse       and supervised by the endoscopist. The following parameters were       monitored: oxygen saturation, heart rate, blood pressure, respiratory       rate, EKG, adequacy of pulmonary  ventilation, and response to care.       Total physician intraservice time was 23 minutes. Recommendation:           - Patient has a contact number available for                            emergencies. The signs and symptoms of potential                            delayed complications were discussed with the                            patient. Return to normal activities tomorrow.                            Written discharge instructions were provided to the                            patient.                           - Advance diet as tolerated.                           - Continue present medications. However, increase                            omeprazole to 40 mg twice daily i.e. before                            breakfast and supper. New prescription provided                            follow-up on pathology. See colonoscopy report.                           - Return to my office after studies are complete. Procedure Code(s):        --- Professional ---                           [phone], Esophagogastroduodenoscopy, flexible,                            transoral; diagnostic, including collection of  specimen(s) by brushing or washing, when performed                            (separate procedure)                           43450, Dilation of esophagus, by unguided sound or                            bougie, single or multiple passes                           99153, Moderate sedation; each additional 15                            minutes intraservice time                           G0500, Moderate sedation services provided by the                            same physician or other qualified health care                            professional performing a gastrointestinal                            endoscopic service that sedation supports,                            requiring the presence of an independent trained                            observer to assist in  the monitoring of the                            patient's level of consciousness and physiological                            status; initial 15 minutes of intra-service time;                            patient age 41 years or older (additional time may                            be reported with [id number], as appropriate) Diagnosis Code(s):        --- Professional ---                           K22.2, Esophageal obstruction                           K44.9, Diaphragmatic hernia without obstruction or  gangrene                           R13.10, Dysphagia, unspecified                           R93.3, Abnormal findings on diagnostic imaging of                            other parts of digestive tract CPT copyright 2019 American Medical Association. All rights reserved. The codes documented in this report are preliminary and upon coder review may  be revised to meet current compliance requirements. Cristopher Estimable. Toan Mort, MD Norvel Richards, MD 02/27/2021 12:00:24 PM This report has been signed electronically. Number of Addenda: 0

## 2021-02-27 NOTE — Discharge Instructions (Signed)
EGD Discharge instructions Please read the instructions outlined below and refer to this sheet in the next few weeks. These discharge instructions provide you with general information on caring for yourself after you leave the hospital. Your doctor may also give you specific instructions. While your treatment has been planned according to the most current medical practices available, unavoidable complications occasionally occur. If you have any problems or questions after discharge, please call your doctor. ACTIVITY  You may resume your regular activity but move at a slower pace for the next 24 hours.   Take frequent rest periods for the next 24 hours.   Walking will help expel (get rid of) the air and reduce the bloated feeling in your abdomen.   No driving for 24 hours (because of the anesthesia (medicine) used during the test).   You may shower.   Do not sign any important legal documents or operate any machinery for 24 hours (because of the anesthesia used during the test).  NUTRITION  Drink plenty of fluids.   You may resume your normal diet.   Begin with a light meal and progress to your normal diet.   Avoid alcoholic beverages for 24 hours or as instructed by your caregiver.  MEDICATIONS  You may resume your normal medications unless your caregiver tells you otherwise.  WHAT YOU CAN EXPECT TODAY  You may experience abdominal discomfort such as a feeling of fullness or "gas" pains.  FOLLOW-UP  Your doctor will discuss the results of your test with you.  SEEK IMMEDIATE MEDICAL ATTENTION IF ANY OF THE FOLLOWING OCCUR:  Excessive nausea (feeling sick to your stomach) and/or vomiting.   Severe abdominal pain and distention (swelling).   Trouble swallowing.   Temperature over 101 F (37.8 C).   Rectal bleeding or vomiting of blood.    You had a narrowing in your esophagus which was dilated.  Your stomach was inflamed.  Biopsies taken.  Increase omeprazole to 40 mg  twice daily-before breakfast and supper.  New prescription provided  2 polyps removed from your colon.  Further recommendations to follow pending review of pathology report  Office visit with [place] in 2 months  At patient request, I called Davonn Flanery at [phone] -reviewed results.     Colonoscopy, Adult, Care After This sheet gives you information about how to care for yourself after your procedure. Your doctor may also give you more specific instructions. If you have problems or questions, call your doctor. What can I expect after the procedure? After the procedure, it is common to have:  A small amount of blood in your poop (stool) for 24 hours.  Some gas.  Mild cramping or bloating in your belly (abdomen). Follow these instructions at home: Eating and drinking  Drink enough fluid to keep your pee (urine) pale yellow.  Follow instructions from your doctor about what you cannot eat or drink.  Return to your normal diet as told by your doctor. Avoid heavy or fried foods that are hard to digest.   Activity  Rest as told by your doctor.  Do not sit for a long time without moving. Get up to take short walks every 1-2 hours. This is important. Ask for help if you feel weak or unsteady.  Return to your normal activities as told by your doctor. Ask your doctor what activities are safe for you. To help cramping and bloating:  Try walking around.  Put heat on your belly as told by your doctor. Use the  heat source that your doctor recommends, such as a moist heat pack or a heating pad. ? Put a towel between your skin and the heat source. ? Leave the heat on for 20-30 minutes. ? Remove the heat if your skin turns bright red. This is very important if you are unable to feel pain, heat, or cold. You may have a greater risk of getting burned.   General instructions  If you were given a medicine to help you relax (sedative) during your procedure, it can affect you for many  hours. Do not drive or use machinery until your doctor says that it is safe.  For the first 24 hours after the procedure: ? Do not sign important documents. ? Do not drink alcohol. ? Do your daily activities more slowly than normal. ? Eat foods that are soft and easy to digest.  Take over-the-counter or prescription medicines only as told by your doctor.  Keep all follow-up visits as told by your doctor. This is important. Contact a doctor if:  You have blood in your poop 2-3 days after the procedure. Get help right away if:  You have more than a small amount of blood in your poop.  You see large clumps of tissue (blood clots) in your poop.  Your belly is swollen.  You feel like you may vomit (nauseous).  You vomit.  You have a fever.  You have belly pain that gets worse, and medicine does not help your pain. Summary  After the procedure, it is common to have a small amount of blood in your poop. You may also have mild cramping and bloating in your belly.  If you were given a medicine to help you relax (sedative) during your procedure, it can affect you for many hours. Do not drive or use machinery until your doctor says that it is safe.  Get help right away if you have a lot of blood in your poop, feel like you may vomit, have a fever, or have more belly pain. This information is not intended to replace advice given to you by your health care provider. Make sure you discuss any questions you have with your health care provider. Document Revised: 08/26/2019 Document Reviewed: 05/16/2019 Elsevier Patient Education  2021 Elsevier Inc.     Colon Polyps  Colon polyps are tissue growths inside the colon, which is part of the large intestine. They are one of the types of polyps that can grow in the body. A polyp may be a round bump or a mushroom-shaped growth. You could have one polyp or more than one. Most colon polyps are noncancerous (benign). However, some colon polyps  can become cancerous over time. Finding and removing the polyps early can help prevent this. What are the causes? The exact cause of colon polyps is not known. What increases the risk? The following factors may make you more likely to develop this condition:  Having a family history of colorectal cancer or colon polyps.  Being older than 66 years of age.  Being younger than 66 years of age and having a significant family history of colorectal cancer or colon polyps or a genetic condition that puts you at higher risk of getting colon polyps.  Having inflammatory bowel disease, such as ulcerative colitis or Crohn's disease.  Having certain conditions passed from parent to child (hereditary conditions), such as: ? Familial adenomatous polyposis (FAP). ? Lynch syndrome. ? Turcot syndrome. ? Peutz-Jeghers syndrome. ? MUTYH-associated polyposis (MAP).  Being overweight.  Certain lifestyle factors. These include smoking cigarettes, drinking too much alcohol, not getting enough exercise, and eating a diet that is high in fat and red meat and low in fiber.  Having had childhood cancer that was treated with radiation of the abdomen. What are the signs or symptoms? Many times, there are no symptoms. If you have symptoms, they may include:  Blood coming from the rectum during a bowel movement.  Blood in the stool (feces). The blood may be bright red or very dark in color.  Pain in the abdomen.  A change in bowel habits, such as constipation or diarrhea. How is this diagnosed? This condition is diagnosed with a colonoscopy. This is a procedure in which a lighted, flexible scope is inserted into the opening between the buttocks (anus) and then passed into the colon to examine the area. Polyps are sometimes found when a colonoscopy is done as part of routine cancer screening tests. How is this treated? This condition is treated by removing any polyps that are found. Most polyps can be  removed during a colonoscopy. Those polyps will then be tested for cancer. Additional treatment may be needed depending on the results of testing. Follow these instructions at home: Eating and drinking  Eat foods that are high in fiber, such as fruits, vegetables, and whole grains.  Eat foods that are high in calcium and vitamin D, such as milk, cheese, yogurt, eggs, liver, fish, and broccoli.  Limit foods that are high in fat, such as fried foods and desserts.  Limit the amount of red meat, precooked or cured meat, or other processed meat that you eat, such as hot dogs, sausages, bacon, or meat loaves.  Limit sugary drinks.   Lifestyle  Maintain a healthy weight, or lose weight if recommended by your health care provider.  Exercise every day or as told by your health care provider.  Do not use any products that contain nicotine or tobacco, such as cigarettes, e-cigarettes, and chewing tobacco. If you need help quitting, ask your health care provider.  Do not drink alcohol if: ? Your health care provider tells you not to drink. ? You are pregnant, may be pregnant, or are planning to become pregnant.  If you drink alcohol: ? Limit how much you use to:  0-1 drink a day for women.  0-2 drinks a day for men. ? Know how much alcohol is in your drink. In the [place] one drink equals one 12 oz bottle of beer (355 mL), one 5 oz glass of wine (148 mL), or one 1 oz glass of hard liquor (44 mL). General instructions  Take over-the-counter and prescription medicines only as told by your health care provider.  Keep all follow-up visits. This is important. This includes having regularly scheduled colonoscopies. Talk to your health care provider about when you need a colonoscopy. Contact a health care provider if:  You have new or worsening bleeding during a bowel movement.  You have new or increased blood in your stool.  You have a change in bowel habits.  You lose weight for no known  reason. Summary  Colon polyps are tissue growths inside the colon, which is part of the large intestine. They are one type of polyp that can grow in the body.  Most colon polyps are noncancerous (benign), but some can become cancerous over time.  This condition is diagnosed with a colonoscopy.  This condition is treated by removing any polyps that are found. Most polyps can  be removed during a colonoscopy. This information is not intended to replace advice given to you by your health care provider. Make sure you discuss any questions you have with your health care provider. Document Revised: 02/08/2020 Document Reviewed: 02/08/2020 Elsevier Patient Education  2021 [employer].

## 2021-02-27 NOTE — Op Note (Signed)
[facility] Patient Name: Richard Velez Procedure Date: 02/27/2021 11:26 AM MRN: [id number] Date of Birth: 1956-05-23 Attending MD: Norvel Richards , MD CSN: [id number] Age: 65 Admit Type: Outpatient Procedure:                Colonoscopy Indications:              Screening for colorectal malignant neoplasm Providers:                Norvel Richards, MD, Jeanann Lewandowsky. Sharon Seller, RN,                            Raphael Gibney, Technician Referring MD:              Medicines:                Midazolam 10 mg IV, Meperidine 50 mg IV Complications:            No immediate complications. Estimated Blood Loss:     Estimated blood loss: none. Procedure:                Pre-Anesthesia Assessment:                           - Prior to the procedure, a History and Physical                            was performed, and patient medications and                            allergies were reviewed. The patient's tolerance of                            previous anesthesia was also reviewed. The risks                            and benefits of the procedure and the sedation                            options and risks were discussed with the patient.                            All questions were answered, and informed consent                            was obtained. Prior Anticoagulants: The patient has                            taken no previous anticoagulant or antiplatelet                            agents. ASA Grade Assessment: III - A patient with                            severe systemic disease. After reviewing the risks  and benefits, the patient was deemed in                            satisfactory condition to undergo the procedure.                           After obtaining informed consent, the colonoscope                            was passed under direct vision. Throughout the                            procedure, the patient's blood pressure, pulse, and                             oxygen saturations were monitored continuously. The                            [id number] ([id number]) scope was introduced through                            the anus and advanced to the the cecum, identified                            by appendiceal orifice and ileocecal valve. The                            colonoscopy was performed without difficulty. The                            patient tolerated the procedure well. The quality                            of the bowel preparation was adequate. Scope In: 11:31:54 AM Scope Out: 11:51:24 AM Scope Withdrawal Time: 0 hours 13 minutes 40 seconds  Total Procedure Duration: 0 hours 19 minutes 30 seconds  Findings:      The perianal and digital rectal examinations were normal.      Two sessile polyps were found in the ileocecal valve. The polyps were 5       to 6 mm in size. These polyps were removed with a cold snare. Resection       and retrieval were complete. Estimated blood loss was minimal.      The exam was otherwise without abnormality on direct and retroflexion       views. Impression:               - Two 5 to 6 mm polyps at the ileocecal valve,                            removed with a cold snare. Resected and retrieved.                           - The examination was otherwise normal on direct  and retroflexion views. Moderate Sedation:      Moderate (conscious) sedation was administered by the endoscopy nurse       and supervised by the endoscopist. The following parameters were       monitored: oxygen saturation, heart rate, blood pressure, respiratory       rate, EKG, adequacy of pulmonary ventilation, and response to care.       Total physician intraservice time was 23 minutes. Recommendation:           - Patient has a contact number available for                            emergencies. The signs and symptoms of potential                            delayed complications were  discussed with the                            patient. Return to normal activities tomorrow.                            Written discharge instructions were provided to the                            patient.                           - Resume previous diet.                           - Repeat colonoscopy date to be determined after                            pending pathology results are reviewed for                            surveillance.                           - Return to GI office in 2 months. See EGD report Procedure Code(s):        --- Professional ---                           [phone], Colonoscopy, flexible; with removal of                            tumor(s), polyp(s), or other lesion(s) by snare                            technique                           99153, Moderate sedation; each additional 15                            minutes intraservice time  G0500, Moderate sedation services provided by the                            same physician or other qualified health care                            professional performing a gastrointestinal                            endoscopic service that sedation supports,                            requiring the presence of an independent trained                            observer to assist in the monitoring of the                            patient's level of consciousness and physiological                            status; initial 15 minutes of intra-service time;                            patient age 48 years or older (additional time may                            be reported with [phone], as appropriate) Diagnosis Code(s):        --- Professional ---                           Z12.11, Encounter for screening for malignant                            neoplasm of colon                           K63.5, Polyp of colon CPT copyright 2019 American Medical Association. All rights reserved. The codes documented in  this report are preliminary and upon coder review may  be revised to meet current compliance requirements. Cristopher Estimable. Anterrio Mccleery, MD Norvel Richards, MD 02/27/2021 12:03:22 PM This report has been signed electronically. Number of Addenda: 0

## 2021-02-28 LAB — SURGICAL PATHOLOGY

## 2021-03-01 ENCOUNTER — Encounter: Payer: Self-pay | Admitting: Internal Medicine

## 2021-03-01 ENCOUNTER — Encounter (HOSPITAL_COMMUNITY): Payer: Self-pay | Admitting: Internal Medicine

## 2021-03-19 ENCOUNTER — Telehealth: Payer: Self-pay | Admitting: Family Medicine

## 2021-03-19 NOTE — Telephone Encounter (Signed)
Pt wants to make sure that he is taking the correct bp medication. The pharmacy gave him a different name than the one we have listed, he said he thinks that it is the generic version but would like to be sure before he takes it

## 2021-03-19 NOTE — Telephone Encounter (Signed)
Pt called and he states that he had 2 BP meds - 1 lisinopril and 1 losartan. Aware that he should d/c the lisinopril and only be taking the losartan   Has appt here soon

## 2021-03-26 ENCOUNTER — Ambulatory Visit (INDEPENDENT_AMBULATORY_CARE_PROVIDER_SITE_OTHER): Payer: Medicare Other | Admitting: Family Medicine

## 2021-03-26 ENCOUNTER — Encounter: Payer: Self-pay | Admitting: Family Medicine

## 2021-03-26 ENCOUNTER — Other Ambulatory Visit: Payer: Self-pay

## 2021-03-26 VITALS — BP 150/82 | HR 78 | Temp 99.2°F | Ht 68.0 in | Wt 175.4 lb

## 2021-03-26 DIAGNOSIS — R42 Dizziness and giddiness: Secondary | ICD-10-CM | POA: Diagnosis not present

## 2021-03-26 DIAGNOSIS — I700 Atherosclerosis of aorta: Secondary | ICD-10-CM | POA: Diagnosis not present

## 2021-03-26 DIAGNOSIS — I10 Essential (primary) hypertension: Secondary | ICD-10-CM

## 2021-03-26 MED ORDER — ASPIRIN 81 MG PO TBEC: 81.0000 mg | DELAYED_RELEASE_TABLET | Freq: Every day | ORAL | 12 refills | Status: DC

## 2021-03-26 MED ORDER — MECLIZINE HCL 25 MG PO TABS: 25.0000 mg | ORAL_TABLET | Freq: Three times a day (TID) | ORAL | 3 refills | Status: DC | PRN

## 2021-03-26 MED ORDER — LOSARTAN POTASSIUM 100 MG PO TABS: 100.0000 mg | ORAL_TABLET | Freq: Every day | ORAL | 2 refills | Status: DC

## 2021-03-26 NOTE — Patient Instructions (Signed)
DASH Eating Plan DASH stands for Dietary Approaches to Stop Hypertension. The DASH eating plan is a healthy eating plan that has been shown to:  Reduce high blood pressure (hypertension).  Reduce your risk for type 2 diabetes, heart disease, and stroke.  Help with weight loss. What are tips for following this plan? Reading food labels  Check food labels for the amount of salt (sodium) per serving. Choose foods with less than 5 percent of the Daily Value of sodium. Generally, foods with less than 300 milligrams (mg) of sodium per serving fit into this eating plan.  To find whole grains, look for the word "whole" as the first word in the ingredient list. Shopping  Buy products labeled as "low-sodium" or "no salt added."  Buy fresh foods. Avoid canned foods and pre-made or frozen meals. Cooking  Avoid adding salt when cooking. Use salt-free seasonings or herbs instead of table salt or sea salt. Check with your health care provider or pharmacist before using salt substitutes.  Do not fry foods. Cook foods using healthy methods such as baking, boiling, grilling, roasting, and broiling instead.  Cook with heart-healthy oils, such as olive, canola, avocado, soybean, or sunflower oil. Meal planning  Eat a balanced diet that includes: ? 4 or more servings of fruits and 4 or more servings of vegetables each day. Try to fill one-half of your plate with fruits and vegetables. ? 6-8 servings of whole grains each day. ? Less than 6 oz (170 g) of lean meat, poultry, or fish each day. A 3-oz (85-g) serving of meat is about the same size as a deck of cards. One egg equals 1 oz (28 g). ? 2-3 servings of low-fat dairy each day. One serving is 1 cup (237 mL). ? 1 serving of nuts, seeds, or beans 5 times each week. ? 2-3 servings of heart-healthy fats. Healthy fats called omega-3 fatty acids are found in foods such as walnuts, flaxseeds, fortified milks, and eggs. These fats are also found in  cold-water fish, such as sardines, salmon, and mackerel.  Limit how much you eat of: ? Canned or prepackaged foods. ? Food that is high in trans fat, such as some fried foods. ? Food that is high in saturated fat, such as fatty meat. ? Desserts and other sweets, sugary drinks, and other foods with added sugar. ? Full-fat dairy products.  Do not salt foods before eating.  Do not eat more than 4 egg yolks a week.  Try to eat at least 2 vegetarian meals a week.  Eat more home-cooked food and less restaurant, buffet, and fast food.   Lifestyle  When eating at a restaurant, ask that your food be prepared with less salt or no salt, if possible.  If you drink alcohol: ? Limit how much you use to:  0-1 drink a day for women who are not pregnant.  0-2 drinks a day for men. ? Be aware of how much alcohol is in your drink. In the [place] one drink equals one 12 oz bottle of beer (355 mL), one 5 oz glass of wine (148 mL), or one 1 oz glass of hard liquor (44 mL). General information  Avoid eating more than 2,300 mg of salt a day. If you have hypertension, you may need to reduce your sodium intake to 1,500 mg a day.  Work with your health care provider to maintain a healthy body weight or to lose weight. Ask what an ideal weight is for you.    Get at least 30 minutes of exercise that causes your heart to beat faster (aerobic exercise) most days of the week. Activities may include walking, swimming, or biking.  Work with your health care provider or dietitian to adjust your eating plan to your individual calorie needs. What foods should I eat? Fruits All fresh, dried, or frozen fruit. Canned fruit in natural juice (without added sugar). Vegetables Fresh or frozen vegetables (raw, steamed, roasted, or grilled). Low-sodium or reduced-sodium tomato and vegetable juice. Low-sodium or reduced-sodium tomato sauce and tomato paste. Low-sodium or reduced-sodium canned vegetables. Grains Whole-grain  or whole-wheat bread. Whole-grain or whole-wheat pasta. Brown rice. Oatmeal. Quinoa. Bulgur. Whole-grain and low-sodium cereals. Pita bread. Low-fat, low-sodium crackers. Whole-wheat flour tortillas. Meats and other proteins Skinless chicken or turkey. Ground chicken or turkey. Pork with fat trimmed off. Fish and seafood. Egg whites. Dried beans, peas, or lentils. Unsalted nuts, nut butters, and seeds. Unsalted canned beans. Lean cuts of beef with fat trimmed off. Low-sodium, lean precooked or cured meat, such as sausages or meat loaves. Dairy Low-fat (1%) or fat-free (skim) milk. Reduced-fat, low-fat, or fat-free cheeses. Nonfat, low-sodium ricotta or cottage cheese. Low-fat or nonfat yogurt. Low-fat, low-sodium cheese. Fats and oils Soft margarine without trans fats. Vegetable oil. Reduced-fat, low-fat, or light mayonnaise and salad dressings (reduced-sodium). Canola, safflower, olive, avocado, soybean, and sunflower oils. Avocado. Seasonings and condiments Herbs. Spices. Seasoning mixes without salt. Other foods Unsalted popcorn and pretzels. Fat-free sweets. The items listed above may not be a complete list of foods and beverages you can eat. Contact a dietitian for more information. What foods should I avoid? Fruits Canned fruit in a light or heavy syrup. Fried fruit. Fruit in cream or butter sauce. Vegetables Creamed or fried vegetables. Vegetables in a cheese sauce. Regular canned vegetables (not low-sodium or reduced-sodium). Regular canned tomato sauce and paste (not low-sodium or reduced-sodium). Regular tomato and vegetable juice (not low-sodium or reduced-sodium). Pickles. Olives. Grains Baked goods made with fat, such as croissants, muffins, or some breads. Dry pasta or rice meal packs. Meats and other proteins Fatty cuts of meat. Ribs. Fried meat. Bacon. Bologna, salami, and other precooked or cured meats, such as sausages or meat loaves. Fat from the back of a pig (fatback).  Bratwurst. Salted nuts and seeds. Canned beans with added salt. Canned or smoked fish. Whole eggs or egg yolks. Chicken or turkey with skin. Dairy Whole or 2% milk, cream, and half-and-half. Whole or full-fat cream cheese. Whole-fat or sweetened yogurt. Full-fat cheese. Nondairy creamers. Whipped toppings. Processed cheese and cheese spreads. Fats and oils Butter. Stick margarine. Lard. Shortening. Ghee. Bacon fat. Tropical oils, such as coconut, palm kernel, or palm oil. Seasonings and condiments Onion salt, garlic salt, seasoned salt, table salt, and sea salt. Worcestershire sauce. Tartar sauce. Barbecue sauce. Teriyaki sauce. Soy sauce, including reduced-sodium. Steak sauce. Canned and packaged gravies. Fish sauce. Oyster sauce. Cocktail sauce. Store-bought horseradish. Ketchup. Mustard. Meat flavorings and tenderizers. Bouillon cubes. Hot sauces. Pre-made or packaged marinades. Pre-made or packaged taco seasonings. Relishes. Regular salad dressings. Other foods Salted popcorn and pretzels. The items listed above may not be a complete list of foods and beverages you should avoid. Contact a dietitian for more information. Where to find more information  National Heart, Lung, and Blood Institute: www.nhlbi.nih.gov  American Heart Association: www.heart.org  Academy of Nutrition and Dietetics: www.eatright.org  National Kidney Foundation: www.kidney.org Summary  The DASH eating plan is a healthy eating plan that has been shown to reduce high   blood pressure (hypertension). It may also reduce your risk for type 2 diabetes, heart disease, and stroke.  When on the DASH eating plan, aim to eat more fresh fruits and vegetables, whole grains, lean proteins, low-fat dairy, and heart-healthy fats.  With the DASH eating plan, you should limit salt (sodium) intake to 2,300 mg a day. If you have hypertension, you may need to reduce your sodium intake to 1,500 mg a day.  Work with your health care  provider or dietitian to adjust your eating plan to your individual calorie needs. This information is not intended to replace advice given to you by your health care provider. Make sure you discuss any questions you have with your health care provider. Document Revised: 09/23/2019 Document Reviewed: 09/23/2019 Elsevier Patient Education  2021 Elsevier Inc.   

## 2021-03-26 NOTE — Progress Notes (Signed)
Assessment & Plan:  1. Essential hypertension Uncontrolled.  Losartan increased from 50 mg to 100 mg once daily.  Education provided on the DASH diet. - losartan (COZAAR) 100 MG tablet; Take 1 tablet (100 mg total) by mouth daily.  Dispense: 30 tablet; Refill: 2  2. Aortic atherosclerosis (HCC) Continue atorvastatin.  Added aspirin 81 mg once daily. - aspirin 81 MG EC tablet; Take 1 tablet (81 mg total) by mouth daily. Swallow whole.  Dispense: 30 tablet; Refill: 12   Return in about 6 weeks (around 05/07/2021) for HTN.  [name], MSN, APRN, FNP-C [facility]  Subjective:    Patient ID: Richard Velez, male    DOB: [birth year], 65 y.o.   MRN: [id number]  Patient Care Team: Loman Brooklyn, FNP as PCP - General (Family Medicine) Gala Romney Cristopher Estimable, MD as Consulting Physician (Gastroenterology)   Chief Complaint:  Chief Complaint  Patient presents with  . Hypertension    6 week follow up     HPI: Richard Velez is a 65 y.o. male presenting on 03/26/2021 for Hypertension (6 week follow up )  Patient is following up on hypertension.  He was switched from lisinopril to losartan due to a cough with lisinopril.  He does not check his blood pressure at home.  He does not limit salt.  He does not exercise.  New complaints: None  Social history:  Relevant past medical, surgical, family and social history reviewed and updated as indicated. Interim medical history since our last visit reviewed.  Allergies and medications reviewed and updated.  DATA REVIEWED: CHART IN EPIC  ROS: Negative unless specifically indicated above in HPI.    Current Outpatient Medications:  .  albuterol (VENTOLIN HFA) 108 (90 Base) MCG/ACT inhaler, Inhale 2 puffs into the lungs every 6 (six) hours as needed., Disp: 18 g, Rfl: 2 .  atorvastatin (LIPITOR) 10 MG tablet, Take 1 tablet (10 mg total) by mouth daily., Disp: 30 tablet, Rfl: 2 .  COZAAR 50 MG tablet, Take 1 tablet (50 mg  total) by mouth daily., Disp: 30 tablet, Rfl: 2 .  meclizine (ANTIVERT) 25 MG tablet, Take 1 tablet (25 mg total) by mouth 3 (three) times daily as needed for dizziness., Disp: 30 tablet, Rfl: 3 .  omeprazole (PRILOSEC) 20 MG capsule, Take 1 capsule (20 mg total) by mouth daily., Disp: 30 capsule, Rfl: 3   Allergies  Allergen Reactions  . Lisinopril Cough  . Oxycodone-Acetaminophen Itching and Nausea Only   Past Medical History:  Diagnosis Date  . Aortic atherosclerosis ([place]) 02/19/2021  . Cataract   . Emphysema lung ([place]) 02/19/2021  . Enlarged prostate 01/10/2021  . Essential hypertension 01/10/2021  . Gastroesophageal reflux disease 01/10/2021  . History of kidney stones   . Lung nodules    Benign in appearance on low-dose lung cancer screening CT on 02/19/2021  . Mixed hyperlipidemia 01/11/2021    Past Surgical History:  Procedure Laterality Date  . BIOPSY  02/27/2021   Procedure: BIOPSY;  Surgeon: [name], MD;  [facility];  Service: Endoscopy;;  gastric esophageal  . COLONOSCOPY N/A 02/27/2021   Procedure: COLONOSCOPY;  Surgeon: [name], MD;  [facility];  Service: Endoscopy;  Laterality: N/A;  PM  . ESOPHAGOGASTRODUODENOSCOPY N/A 02/27/2021   Procedure: ESOPHAGOGASTRODUODENOSCOPY (EGD);  Surgeon: [name], MD;  [facility];  Service: Endoscopy;  Laterality: N/A;  . EYE SURGERY Bilateral    cataract  . MALONEY  DILATION N/A 02/27/2021   Procedure: Venia Minks DILATION;  Surgeon: [name], MD;  [facility];  Service: Endoscopy;  Laterality: N/A;  . POLYPECTOMY  02/27/2021   Procedure: POLYPECTOMY;  Surgeon: [name], MD;  [facility];  Service: Endoscopy;;  colon    Social History   Socioeconomic History  . Marital status: Married    Spouse name: Not on file  . Number of children: Not on file  . Years of education: Not on file  . Highest education level: Not on file  Occupational  History  . Occupation: Retired    Comment: Delivered ice  Tobacco Use  . Smoking status: Current Every Day Smoker    Packs/day: 1.00    Types: Cigarettes    Start date: 01/11/1971  . Smokeless tobacco: Never Used  Vaping Use  . Vaping Use: Never used  Substance and Sexual Activity  . Alcohol use: Yes    Comment: occ  . Drug use: Never  . Sexual activity: Not on file  Other Topics Concern  . Not on file  Social History Narrative  . Not on file   Social Determinants of Health   Financial Resource Strain: Not on file  Food Insecurity: Not on file  Transportation Needs: Not on file  Physical Activity: Not on file  Stress: Not on file  Social Connections: Not on file  Intimate Partner Violence: Not on file        Objective:    BP (!) 150/82   Pulse 78   Temp 99.2 F (37.3 C) (Temporal)   Ht _0  (1.727 m)   Wt 175 lb 6.4 oz (79.6 kg)   SpO2 97%   BMI 26.67 kg/m   Wt Readings from Last 3 Encounters:  03/26/21 175 lb 6.4 oz (79.6 kg)  02/27/21 174 lb (78.9 kg)  02/21/21 174 lb (78.9 kg)    Physical Exam Vitals reviewed.  Constitutional:      General: He is not in acute distress.    Appearance: Normal appearance. He is overweight. He is not ill-appearing, toxic-appearing or diaphoretic.  HENT:     Head: Normocephalic and atraumatic.  Eyes:     General: No scleral icterus.       Right eye: No discharge.        Left eye: No discharge.     Conjunctiva/sclera: Conjunctivae normal.  Cardiovascular:     Rate and Rhythm: Normal rate and regular rhythm.     Heart sounds: Normal heart sounds. No murmur heard. No friction rub. No gallop.   Pulmonary:     Effort: Pulmonary effort is normal. No respiratory distress.     Breath sounds: Normal breath sounds. No stridor. No wheezing, rhonchi or rales.  Musculoskeletal:        General: Normal range of motion.     Cervical back: Normal range of motion.  Skin:    General: Skin is warm and dry.  Neurological:      Mental Status: He is alert and oriented to person, place, and time. Mental status is at baseline.  Psychiatric:        Mood and Affect: Mood normal.        Behavior: Behavior normal.        Thought Content: Thought content normal.        Judgment: Judgment normal.     Lab Results  Component Value Date   TSH 1.340 01/08/2021   Lab Results  Component Value  Date   WBC 11.5 (H) 01/08/2021   HGB 17.6 01/08/2021   HCT 49.8 01/08/2021   MCV 91 01/08/2021   PLT 285 01/08/2021   Lab Results  Component Value Date   NA 140 01/08/2021   K 4.3 01/08/2021   CO2 20 01/08/2021   GLUCOSE 114 (H) 01/08/2021   BUN 19 01/08/2021   CREATININE 0.94 01/08/2021   BILITOT 0.4 01/08/2021   ALKPHOS 140 (H) 01/08/2021   AST 20 01/08/2021   ALT 36 01/08/2021   PROT 6.8 01/08/2021   ALBUMIN 4.2 01/08/2021   CALCIUM 9.5 01/08/2021   EGFR 90 01/08/2021   Lab Results  Component Value Date   CHOL 217 (H) 01/08/2021   Lab Results  Component Value Date   HDL 37 (L) 01/08/2021   Lab Results  Component Value Date   LDLCALC 128 (H) 01/08/2021   Lab Results  Component Value Date   TRIG 289 (H) 01/08/2021   Lab Results  Component Value Date   CHOLHDL 5.9 (H) 01/08/2021   No results found for: HGBA1C

## 2021-04-16 ENCOUNTER — Other Ambulatory Visit: Payer: Self-pay | Admitting: Family Medicine

## 2021-04-16 DIAGNOSIS — E782 Mixed hyperlipidemia: Secondary | ICD-10-CM

## 2021-04-17 ENCOUNTER — Other Ambulatory Visit: Payer: Self-pay

## 2021-04-17 ENCOUNTER — Ambulatory Visit (INDEPENDENT_AMBULATORY_CARE_PROVIDER_SITE_OTHER): Payer: Medicare Other | Admitting: Gastroenterology

## 2021-04-17 ENCOUNTER — Encounter: Payer: Self-pay | Admitting: Gastroenterology

## 2021-04-17 ENCOUNTER — Ambulatory Visit: Payer: Medicare Other | Admitting: Nurse Practitioner

## 2021-04-17 VITALS — BP 152/83 | HR 79 | Temp 97.8°F | Ht 68.0 in | Wt 172.8 lb

## 2021-04-17 DIAGNOSIS — R1319 Other dysphagia: Secondary | ICD-10-CM

## 2021-04-17 DIAGNOSIS — K219 Gastro-esophageal reflux disease without esophagitis: Secondary | ICD-10-CM | POA: Diagnosis not present

## 2021-04-17 NOTE — Patient Instructions (Addendum)
Recommend barium pill esophogram to further evaluate difficulty with food becoming stuck. Please let us know if you decide you would like to proceed with this evaluation.  Make sure you are taking small bites of food, chewing thoroughly and sitting upright while eating to help prevent food becoming stuck.    Continue your omeprazole 40mg  twice daily.   We will see you in 3 months.

## 2021-04-17 NOTE — Progress Notes (Signed)
Referring Provider: Loman Brooklyn, FNP Primary Care Physician:  Loman Brooklyn, FNP Primary GI Physician: Dr. Gala Romney  Chief Complaint  Patient presents with   Gastroesophageal Reflux    Very little, better since Omeprazole increased   Dysphagia    Sometimes aspirin gets stuck in esophagus    HPI:   Richard Velez is a 65 y.o. male presenting today for follow up of GERD and Dysphagia. Patient referred to [place] in march for c/o dysphagia and documented circumferential wall thickening of the distal esophagus seen on prior CT of the chest. Colonoscopy and EGD performed by Dr. Sydell Axon on 02/27/21. Esophageal stricture found and dilated during procedure. Omeprazole 40mg  increased to BID s/p EGD.   GERD: reports improvement in reflux symptoms since increasing Omeprazole to BID. Minimal reflux symptoms.   Dysphagia: reports some improvement since dilation in April 2022 during EGD however, states that he continues to have difficulty with food and even iquids getting stuck almost daily. Reports that he usually just has to wait for food to finally pass or induce vomiting to get it up.   Denies nausea or vomiting, no constipation or diarrhea, no bloody or black stools, no early satiety or bloating. Does not take NSAIDs   Last Colonoscopy:02/27/21 Two 5 to 6 mm polyps at the ileocecal valve (tubular adenomas), examination was otherwise normal.   Last Endoscopy: 02/27/21 - Distal esophageal stenosis/stricture.-**Dilated** Mild/chronic gastritis, negative for H. Pylori. Squamous esophageal epithelium with parakeratosis  Recommendations:  EGD, repeat as needed r/t symptoms/clinical presentation Repeat surveillance colonoscopy in 2027 r/t recent hx of tubular adenoma.    Past Medical History:  Diagnosis Date   Aortic atherosclerosis ([place]) 02/19/2021   Cataract    Emphysema lung ([place]) 02/19/2021   Enlarged prostate 01/10/2021   Essential hypertension 01/10/2021   Gastroesophageal reflux disease  01/10/2021   History of kidney stones    Lung nodules    Benign in appearance on low-dose lung cancer screening CT on 02/19/2021   Mixed hyperlipidemia 01/11/2021    Past Surgical History:  Procedure Laterality Date   BIOPSY  02/27/2021   Procedure: BIOPSY;  Surgeon: [name], MD;  [facility];  Service: Endoscopy;;  gastric esophageal   COLONOSCOPY N/A 02/27/2021   Two 5 to 6 mm polyps at the ileocecal valve (tubular adenomas), examination was otherwise normal.   ESOPHAGOGASTRODUODENOSCOPY N/A 02/27/2021   Distal esophageal stenosis/stricture.-**Dilated**   EYE SURGERY Bilateral    cataract   MALONEY DILATION N/A 02/27/2021   Procedure: Venia Minks DILATION;  Surgeon: [name], MD;  [facility];  Service: Endoscopy;  Laterality: N/A;   POLYPECTOMY  02/27/2021   Procedure: POLYPECTOMY;  Surgeon: [name], MD;  [facility];  Service: Endoscopy;;  colon    Current Outpatient Medications  Medication Sig Dispense Refill   albuterol (VENTOLIN HFA) 108 (90 Base) MCG/ACT inhaler Inhale 2 puffs into the lungs every 6 (six) hours as needed. 18 g 2   aspirin 81 MG EC tablet Take 1 tablet (81 mg total) by mouth daily. Swallow whole. 30 tablet 12   atorvastatin (LIPITOR) 10 MG tablet TAKE ONE TABLET BY MOUTH EVERY DAY 30 tablet 0   losartan (COZAAR) 100 MG tablet Take 1 tablet (100 mg total) by mouth daily. 30 tablet 2   meclizine (ANTIVERT) 25 MG tablet Take 1 tablet (25 mg total) by mouth 3 (three) times daily as needed for dizziness. 90 tablet 3   omeprazole (PRILOSEC)  40 MG capsule Take 1 capsule by mouth 2 (two) times daily.     No current facility-administered medications for this visit.    Allergies as of 04/17/2021 - Review Complete 04/17/2021  Allergen Reaction Noted   Lisinopril Cough 02/21/2021   Oxycodone-acetaminophen Itching and Nausea Only 12/17/2017    Family History  Problem Relation Age of Onset   Hypertension Mother     Diabetes Mother    Heart attack Father    Diabetes Sister    Diabetes Brother    Colon cancer Neg Hx    Gastric cancer Neg Hx    Esophageal cancer Neg Hx     Social History   Socioeconomic History   Marital status: Married    Spouse name: Not on file   Number of children: Not on file   Years of education: Not on file   Highest education level: Not on file  Occupational History   Occupation: Retired    Comment: Delivered ice  Tobacco Use   Smoking status: Every Day    Packs/day: 1.00    Pack years: 0.00    Types: Cigarettes    Start date: 01/11/1971   Smokeless tobacco: Never  Vaping Use   Vaping Use: Never used  Substance and Sexual Activity   Alcohol use: Yes    Comment: occ   Drug use: Never   Sexual activity: Not on file  Other Topics Concern   Not on file  Social History Narrative   Not on file   Social Determinants of Health   Financial Resource Strain: Not on file  Food Insecurity: Not on file  Transportation Needs: Not on file  Physical Activity: Not on file  Stress: Not on file  Social Connections: Not on file    Review of Systems: Gen: Denies fever, chills, anorexia. Denies fatigue, weakness, weight loss.  CV: Denies chest pain, palpitations, syncope, peripheral edema, and claudication. Resp: Denies dyspnea at rest, cough, wheezing, coughing up blood, and pleurisy. GI: Denies vomiting blood, jaundice, and fecal incontinence.   Denies odynophagia. Endorses dysphagia. Derm: Denies rash, itching, dry skin Psych: Denies depression, anxiety, memory loss, confusion. No homicidal or suicidal ideation.  Heme: Denies bruising, bleeding, and enlarged lymph nodes.  Physical Exam: BP (!) 152/83   Pulse 79   Temp 97.8 F (36.6 C) (Temporal)   Ht 5\' 8"  (1.727 m)   Wt 172 lb 12.8 oz (78.4 kg)   BMI 26.27 kg/m  General:   Alert and oriented. No distress noted. Pleasant and cooperative.  Head:  Normocephalic and atraumatic. Eyes:  Conjuctiva clear  without scleral icterus. Mouth:  Oral mucosa pink and moist. Good dentition. No lesions. Abdomen:  +BS, soft, non-tender and non-distended. No rebound or guarding. No HSM or masses noted. Msk:  Symmetrical without gross deformities. Normal posture. Extremities:  Without edema. Neurologic:  Alert and  oriented x4 Psych:  Alert and cooperative. Normal mood and affect.  ASSESSMENT: Quinnlan Abruzzo is a 65 y.o. male presenting today for follow up of GERD and dysphagia after EGD/colonoscopy performed by Dr Gala Romney in April 2022. Patient reports significant improvement in reflux symptoms since omeprazole was increased to BID. Continues to experience dysphagia with food as well as liquid despite esophageal dilation in April. He reports symptoms have improved some, however, he is having dysphagia almost daily requiring him to "wait it out" and let food pass or induce vomiting to get it back up. Suspicion motility disorder as etiology since EGD in April did not  reveal any other concerning features and patient exhibits no alarm symptoms.     PLAN:  Recommend Barium pill esophogram, however, patient declining at this time. Can proceed with ordering this if patient decides he wants to evaluate symptoms further.  2.  Continue Omeprazole 40mg  BID with good result 3. Make sure you are taking small bites of food, chewing thoroughly and sitting upright while eating to help prevent food becoming stuck.   Follow Up: 3 month   Chelsea L. Alver Sorrow, MSN, APRN, AGNP-C [facility]  Patient was seen in conjunction with Scherrie Gerlach, MSN, APRN, AGNP-C. I agree with all documented and also was part of the visit.   Annitta Needs, PhD, ANP-BC [facility]

## 2021-05-07 ENCOUNTER — Ambulatory Visit (INDEPENDENT_AMBULATORY_CARE_PROVIDER_SITE_OTHER): Payer: Medicare Other | Admitting: Family Medicine

## 2021-05-07 ENCOUNTER — Encounter: Payer: Self-pay | Admitting: Family Medicine

## 2021-05-07 ENCOUNTER — Other Ambulatory Visit: Payer: Self-pay

## 2021-05-07 VITALS — BP 132/86 | HR 85 | Temp 99.8°F | Ht 68.0 in | Wt 173.6 lb

## 2021-05-07 DIAGNOSIS — E782 Mixed hyperlipidemia: Secondary | ICD-10-CM

## 2021-05-07 DIAGNOSIS — I10 Essential (primary) hypertension: Secondary | ICD-10-CM

## 2021-05-07 DIAGNOSIS — R42 Dizziness and giddiness: Secondary | ICD-10-CM | POA: Diagnosis not present

## 2021-05-07 MED ORDER — ATORVASTATIN CALCIUM 10 MG PO TABS: 10.0000 mg | ORAL_TABLET | Freq: Every day | ORAL | 1 refills | Status: DC

## 2021-05-07 NOTE — Progress Notes (Signed)
Assessment & Plan:  1. Essential hypertension Well controlled on current regimen.  - CBC with Differential/Platelet; Future - CMP14+EGFR; Future - Lipid panel; Future  2. Mixed hyperlipidemia Labs to assess since starting atorvastatin. - atorvastatin (LIPITOR) 10 MG tablet; Take 1 tablet (10 mg total) by mouth daily.  Dispense: 90 tablet; Refill: 1 - CBC with Differential/Platelet; Future - CMP14+EGFR; Future - Lipid panel; Future  3. Dizziness Continue meclizine as needed.  Referred to physical therapy to reset the crystals in his ears. - Ambulatory referral to Physical Therapy   Return in about 4 months (around 09/07/2021) for annual physical.  Richard Limes, MSN, APRN, FNP-C [facility]  Subjective:    Patient ID: Richard Velez, male    DOB: Jul 31, 1956, 65 y.o.   MRN: [id number]  Patient Care Team: Loman Brooklyn, FNP as PCP - General (Family Medicine) Gala Romney Cristopher Estimable, MD as Consulting Physician (Gastroenterology)   Chief Complaint:  Chief Complaint  Patient presents with   Hypertension    6 week follow up     HPI: Richard Velez is a 65 y.o. male presenting on 05/07/2021 for Hypertension (6 week follow up )  Patient is following up on hypertension.  At his last appointment losartan was increased from 50 mg to 100 mg once daily.  He does not check his blood pressure at home, although he did buy a cuff.  He does not limit salt.  He does not exercise.  New complaints: Patient reports he is still getting dizzy.  The meclizine is helpful.  It occurs most often first thing in the morning and in the evening when he is lying down.  It gets worse when he rolls over in the bed or moves his eyeballs too quickly.  His mom and several of his siblings all have in her ear problems.   Social history:  Relevant past medical, surgical, family and social history reviewed and updated as indicated. Interim medical history since our last visit  reviewed.  Allergies and medications reviewed and updated.  DATA REVIEWED: CHART IN EPIC  ROS: Negative unless specifically indicated above in HPI.    Current Outpatient Medications:    albuterol (VENTOLIN HFA) 108 (90 Base) MCG/ACT inhaler, Inhale 2 puffs into the lungs every 6 (six) hours as needed., Disp: 18 g, Rfl: 2   aspirin 81 MG EC tablet, Take 1 tablet (81 mg total) by mouth daily. Swallow whole., Disp: 30 tablet, Rfl: 12   atorvastatin (LIPITOR) 10 MG tablet, TAKE ONE TABLET BY MOUTH EVERY DAY, Disp: 30 tablet, Rfl: 0   losartan (COZAAR) 100 MG tablet, Take 1 tablet (100 mg total) by mouth daily., Disp: 30 tablet, Rfl: 2   meclizine (ANTIVERT) 25 MG tablet, Take 1 tablet (25 mg total) by mouth 3 (three) times daily as needed for dizziness., Disp: 90 tablet, Rfl: 3   omeprazole (PRILOSEC) 40 MG capsule, Take 1 capsule by mouth 2 (two) times daily., Disp: , Rfl:    Allergies  Allergen Reactions   Lisinopril Cough   Oxycodone-Acetaminophen Itching and Nausea Only   Past Medical History:  Diagnosis Date   Aortic atherosclerosis ([place]) 02/19/2021   Cataract    Emphysema lung ([place]) 02/19/2021   Enlarged prostate 01/10/2021   Essential hypertension 01/10/2021   Gastroesophageal reflux disease 01/10/2021   History of kidney stones    Lung nodules    Benign in appearance on low-dose lung cancer screening CT on 02/19/2021   Mixed hyperlipidemia 01/11/2021  Past Surgical History:  Procedure Laterality Date   BIOPSY  02/27/2021   Procedure: BIOPSY;  Surgeon: [name], MD;  [facility];  Service: Endoscopy;;  gastric esophageal   COLONOSCOPY N/A 02/27/2021   Two 5 to 6 mm polyps at the ileocecal valve (tubular adenomas), examination was otherwise normal.   ESOPHAGOGASTRODUODENOSCOPY N/A 02/27/2021   Distal esophageal stenosis/stricture.-**Dilated**   EYE SURGERY Bilateral    cataract   MALONEY DILATION N/A 02/27/2021   Procedure: Venia Minks DILATION;  Surgeon:  [name], MD;  [facility];  Service: Endoscopy;  Laterality: N/A;   POLYPECTOMY  02/27/2021   Procedure: POLYPECTOMY;  Surgeon: [name], MD;  [facility];  Service: Endoscopy;;  colon    Social History   Socioeconomic History   Marital status: Married    Spouse name: Not on file   Number of children: Not on file   Years of education: Not on file   Highest education level: Not on file  Occupational History   Occupation: Retired    Comment: Delivered ice  Tobacco Use   Smoking status: Every Day    Packs/day: 1.00    Pack years: 0.00    Types: Cigarettes    Start date: 01/11/1971   Smokeless tobacco: Never  Vaping Use   Vaping Use: Never used  Substance and Sexual Activity   Alcohol use: Yes    Comment: occ   Drug use: Never   Sexual activity: Not on file  Other Topics Concern   Not on file  Social History Narrative   Not on file   Social Determinants of Health   Financial Resource Strain: Not on file  Food Insecurity: Not on file  Transportation Needs: Not on file  Physical Activity: Not on file  Stress: Not on file  Social Connections: Not on file  Intimate Partner Violence: Not on file        Objective:    BP 132/86   Pulse 85   Temp 99.8 F (37.7 C) (Temporal)   Ht '5\' 8"'  (1.727 m)   Wt 173 lb 9.6 oz (78.7 kg)   SpO2 95%   BMI 26.40 kg/m   Wt Readings from Last 3 Encounters:  05/07/21 173 lb 9.6 oz (78.7 kg)  04/17/21 172 lb 12.8 oz (78.4 kg)  03/26/21 175 lb 6.4 oz (79.6 kg)    Physical Exam Vitals reviewed.  Constitutional:      General: He is not in acute distress.    Appearance: Normal appearance. He is overweight. He is not ill-appearing, toxic-appearing or diaphoretic.  HENT:     Head: Normocephalic and atraumatic.  Eyes:     General: No scleral icterus.       Right eye: No discharge.        Left eye: No discharge.     Conjunctiva/sclera: Conjunctivae normal.  Cardiovascular:     Rate and Rhythm:  Normal rate and regular rhythm.     Heart sounds: Normal heart sounds. No murmur heard.   No friction rub. No gallop.  Pulmonary:     Effort: Pulmonary effort is normal. No respiratory distress.     Breath sounds: Normal breath sounds. No stridor. No wheezing, rhonchi or rales.  Musculoskeletal:        General: Normal range of motion.     Cervical back: Normal range of motion.  Skin:    General: Skin is warm and dry.  Neurological:  Mental Status: He is alert and oriented to person, place, and time. Mental status is at baseline.  Psychiatric:        Mood and Affect: Mood normal.        Behavior: Behavior normal.        Thought Content: Thought content normal.        Judgment: Judgment normal.    Lab Results  Component Value Date   TSH 1.340 01/08/2021   Lab Results  Component Value Date   WBC 11.5 (H) 01/08/2021   HGB 17.6 01/08/2021   HCT 49.8 01/08/2021   MCV 91 01/08/2021   PLT 285 01/08/2021   Lab Results  Component Value Date   NA 140 01/08/2021   K 4.3 01/08/2021   CO2 20 01/08/2021   GLUCOSE 114 (H) 01/08/2021   BUN 19 01/08/2021   CREATININE 0.94 01/08/2021   BILITOT 0.4 01/08/2021   ALKPHOS 140 (H) 01/08/2021   AST 20 01/08/2021   ALT 36 01/08/2021   PROT 6.8 01/08/2021   ALBUMIN 4.2 01/08/2021   CALCIUM 9.5 01/08/2021   EGFR 90 01/08/2021   Lab Results  Component Value Date   CHOL 217 (H) 01/08/2021   Lab Results  Component Value Date   HDL 37 (L) 01/08/2021   Lab Results  Component Value Date   LDLCALC 128 (H) 01/08/2021   Lab Results  Component Value Date   TRIG 289 (H) 01/08/2021   Lab Results  Component Value Date   CHOLHDL 5.9 (H) 01/08/2021   No results found for: HGBA1C

## 2021-05-09 ENCOUNTER — Other Ambulatory Visit: Payer: Medicare Other

## 2021-05-09 ENCOUNTER — Other Ambulatory Visit: Payer: Self-pay

## 2021-05-09 DIAGNOSIS — E782 Mixed hyperlipidemia: Secondary | ICD-10-CM

## 2021-05-09 DIAGNOSIS — I10 Essential (primary) hypertension: Secondary | ICD-10-CM

## 2021-05-10 LAB — CBC WITH DIFFERENTIAL/PLATELET
Basophils Absolute: 0.2 10*3/uL (ref 0.0–0.2)
Basos: 2 %
EOS (ABSOLUTE): 0.3 10*3/uL (ref 0.0–0.4)
Eos: 3 %
Hematocrit: 47.1 % (ref 37.5–51.0)
Hemoglobin: 16.5 g/dL (ref 13.0–17.7)
Immature Grans (Abs): 0.1 10*3/uL (ref 0.0–0.1)
Immature Granulocytes: 1 %
Lymphocytes Absolute: 3.5 10*3/uL — ABNORMAL HIGH (ref 0.7–3.1)
Lymphs: 33 %
MCH: 31.6 pg (ref 26.6–33.0)
MCHC: 35 g/dL (ref 31.5–35.7)
MCV: 90 fL (ref 79–97)
Monocytes Absolute: 0.7 10*3/uL (ref 0.1–0.9)
Monocytes: 7 %
Neutrophils Absolute: 5.8 10*3/uL (ref 1.4–7.0)
Neutrophils: 54 %
Platelets: 327 10*3/uL (ref 150–450)
RBC: 5.22 x10E6/uL (ref 4.14–5.80)
RDW: 12.6 % (ref 11.6–15.4)
WBC: 10.6 10*3/uL (ref 3.4–10.8)

## 2021-05-10 LAB — LIPID PANEL
Chol/HDL Ratio: 3.4 ratio (ref 0.0–5.0)
Cholesterol, Total: 125 mg/dL (ref 100–199)
HDL: 37 mg/dL — ABNORMAL LOW (ref >39)
LDL Chol Calc (NIH): 68 mg/dL (ref 0–99)
Triglycerides: 106 mg/dL (ref 0–149)
VLDL Cholesterol Cal: 20 mg/dL (ref 5–40)

## 2021-05-10 LAB — CMP14+EGFR
ALT: 24 IU/L (ref 0–44)
AST: 13 IU/L (ref 0–40)
Albumin/Globulin Ratio: 2 (ref 1.2–2.2)
Albumin: 4.3 g/dL (ref 3.8–4.8)
Alkaline Phosphatase: 159 IU/L — ABNORMAL HIGH (ref 44–121)
BUN/Creatinine Ratio: 15 (ref 10–24)
BUN: 14 mg/dL (ref 8–27)
Bilirubin Total: 0.6 mg/dL (ref 0.0–1.2)
CO2: 21 mmol/L (ref 20–29)
Calcium: 9.4 mg/dL (ref 8.6–10.2)
Chloride: 103 mmol/L (ref 96–106)
Creatinine, Ser: 0.92 mg/dL (ref 0.76–1.27)
Globulin, Total: 2.2 g/dL (ref 1.5–4.5)
Glucose: 103 mg/dL — ABNORMAL HIGH (ref 65–99)
Potassium: 4 mmol/L (ref 3.5–5.2)
Sodium: 140 mmol/L (ref 134–144)
Total Protein: 6.5 g/dL (ref 6.0–8.5)
eGFR: 92 mL/min/{1.73_m2} (ref >59)

## 2021-05-15 ENCOUNTER — Encounter (HOSPITAL_COMMUNITY): Payer: Self-pay | Admitting: Physical Therapy

## 2021-05-15 ENCOUNTER — Other Ambulatory Visit: Payer: Self-pay

## 2021-05-15 ENCOUNTER — Ambulatory Visit (HOSPITAL_COMMUNITY): Payer: Medicare Other | Attending: Family Medicine | Admitting: Physical Therapy

## 2021-05-15 DIAGNOSIS — R42 Dizziness and giddiness: Secondary | ICD-10-CM | POA: Insufficient documentation

## 2021-05-15 NOTE — Therapy (Signed)
[place], [place], [address] Phone: [phone]   Fax:  [phone]  Physical Therapy Evaluation  Patient Details  Name: Richard Velez MRN: [id number] Date of Birth: [birth year] Referring Provider (PT): [name] FNP   Encounter Date: 05/15/2021   PT End of Session - 05/15/21 1701     Visit Number 1    Number of Visits 4    Date for PT Re-Evaluation 06/12/21    Authorization Type [employer] MEdicare/ Medicaid 2ndary    PT Start Time 1520    PT Stop Time 1610    PT Time Calculation (min) 50 min    Activity Tolerance Patient tolerated treatment well    Behavior During Therapy [facility] for tasks assessed/performed             Past Medical History:  Diagnosis Date   Aortic atherosclerosis ([place]) 02/19/2021   Cataract    Emphysema lung ([place]) 02/19/2021   Enlarged prostate 01/10/2021   Essential hypertension 01/10/2021   Gastroesophageal reflux disease 01/10/2021   History of kidney stones    Lung nodules    Benign in appearance on low-dose lung cancer screening CT on 02/19/2021   Mixed hyperlipidemia 01/11/2021    Past Surgical History:  Procedure Laterality Date   BIOPSY  02/27/2021   Procedure: BIOPSY;  Surgeon: [name], MD;  [facility];  Service: Endoscopy;;  gastric esophageal   COLONOSCOPY N/A 02/27/2021   Two 5 to 6 mm polyps at the ileocecal valve (tubular adenomas), examination was otherwise normal.   ESOPHAGOGASTRODUODENOSCOPY N/A 02/27/2021   Distal esophageal stenosis/stricture.-**Dilated**   EYE SURGERY Bilateral    cataract   MALONEY DILATION N/A 02/27/2021   Procedure: Venia Minks DILATION;  Surgeon: [name], MD;  [facility];  Service: Endoscopy;  Laterality: N/A;   POLYPECTOMY  02/27/2021   Procedure: POLYPECTOMY;  Surgeon: [name], MD;  [facility];  Service: Endoscopy;;  colon    There were no vitals filed for this visit.     Subjective Assessment - 05/15/21 1537     Subjective Patient presents to therapy with complaint of dizziness. He says this began about 8 months ago. He states he wash washing his hair and the combination of tilting his head and turning caused him to feel dizzy. HE went to urgent care and was given meclizine. These have been helpful. He begins to describe dizziness as positional but now stats it happens spontaneously when he is sitting outside or if he is moving and then stops. He states without medication dizziness would last all day. Denies having any imaging. Does note intermittent ringing in ears. No vision changes.                [facility] PT Assessment - 05/15/21 0001       Assessment   Medical Diagnosis Vertigo    Referring Provider (PT) [name] FNP      Prior Function   Level of Independence Independent      Cognition   Overall Cognitive Status Within Functional Limits for tasks assessed      Observation/Other Assessments   Focus on Therapeutic Outcomes (FOTO)  49% function      ROM / Strength   AROM / PROM / Strength --   Cervical mobility WFL except mod restricitons in bilateral sidebending     Balance   Balance Assessed Yes      Static Standing Balance  Static Standing Balance -  Activities  Romberg - Eyes Opened;Romberg - Eyes Closed    Static Standing - Comment/# of Minutes WFL, >30 sec min sway                    Vestibular Assessment - 05/15/21 0001       Symptom Behavior   Type of Dizziness  Spinning   "woozy feeling"   Frequency of Dizziness Daily    Duration of Dizziness Possibly all day/ days    Symptom Nature Positional;Spontaneous;Motion provoked    Aggravating Factors Turning head quickly      Oculomotor Exam   Oculomotor Alignment Normal    Ocular ROM WNL    Smooth Pursuits Intact      Vestibulo-Ocular Reflex   VOR 1 Head Only (x 1 viewing) Mild dizziness      Positional Sensitivities   Sit to Supine No dizziness    Supine  to Left Side No dizziness    Supine to Right Side No dizziness    Supine to Sitting Mild dizziness    Head Turning x 5 No dizziness    Head Nodding x 5 Mild dizziness      Orthostatics   Orthostatics Comment Resting BP 159/87, after sit to stand x10 165/85                Objective measurements completed on examination: See above findings.        Vestibular Treatment/Exercise - 05/15/21 0001       Vestibular Treatment/Exercise   Habituation Exercises Nestor Lewandowsky    Gaze Exercises X1 Viewing Horizontal      Nestor Lewandowsky   Number of Reps  1    Symptom Description  mild dizziness sitting upright      X1 Viewing Horizontal   Reps 15    Comments mild dizziness                   PT Education - 05/15/21 1537     Education Details on evaluation findings and POC    Person(s) Educated Patient    Methods Explanation    Comprehension Verbalized understanding              PT Short Term Goals - 05/15/21 1706       PT SHORT TERM GOAL #1   Title Patient will be independant in initial HEP for improved functional outcomes and reduced dizziness    Time 2    Period Weeks    Status New    Target Date 05/29/21      PT SHORT TERM GOAL #2   Title Patient will report at least 50% resolulion of dizziness for improved funcitonal ability and reduced risk for falls.    Time 2    Period Weeks    Status New    Target Date 06/12/21               PT Long Term Goals - 05/15/21 1707       PT LONG TERM GOAL #1   Title Patient will improve FOTO score to predicted value for improved funcitonal outcomes and QOL    Time 4    Period Weeks    Status New    Target Date 06/12/21      PT LONG TERM GOAL #2   Title Patient will report at least 75% resolution of vertigo symptoms for improved funcitonal outcomes and reduced risk for falls.    Time 4  Period Weeks    Status New    Target Date 06/12/21                    Plan - 05/15/21 1701      Clinical Impression Statement Patient is a 65 y.o. male who presents to physical therapy with complaint of dizziness. Patient demonstrates positive result with provocative testing for vestibular issues which are negatively impacting patient ability to perform ADLs and functional mobility tasks. Patient will benefit from skilled physical therapy services to address these deficits to improve level of function with ADLs, functional mobility tasks, and reduce risk for falls.    Examination-Activity Limitations Locomotion Level;Transfers;Lift;Bend    Examination-Participation Restrictions Community Activity;Driving;Cleaning    Stability/Clinical Decision Making Stable/Uncomplicated    Clinical Decision Making Low    Rehab Potential Good    PT Frequency 1x / week    PT Duration 4 weeks    PT Treatment/Interventions ADLs/Self Care Home Management;Canalith Repostioning;Patient/family education;Therapeutic exercise;Therapeutic activities;Functional mobility training;Neuromuscular re-education;Stair training;Gait training;Moist Heat;Traction;Balance training;Vestibular;Passive range of motion;Dry needling;Manual techniques;Spinal Manipulations;Visual/perceptual remediation/compensation    PT Next Visit Plan Review HEP and progress vestibular and habituation exercises as tolerated. Sit to stand wiht head turns, [employer], VOR and Saccades standing, tandem stance. [place] as indicated, was neagtive at [place]: VOR 1, Saccades    Consulted and Agree with Plan of Care Patient             Patient will benefit from skilled therapeutic intervention in order to improve the following deficits and impairments:  Dizziness, Decreased activity tolerance  Visit Diagnosis: Dizziness and giddiness     Problem List Patient Active Problem List   Diagnosis Date Noted   Lip lesion 02/24/2021   Emphysema lung ([place]) 02/19/2021   Aortic atherosclerosis ([place]) 02/19/2021   Dysphagia  01/15/2021   Abnormal CT of the abdomen 01/15/2021   Mixed hyperlipidemia 01/11/2021   Essential hypertension 01/10/2021   Lung nodule 01/10/2021   Tobacco use 01/10/2021   Enlarged prostate 01/10/2021   Esophageal dysphagia 01/10/2021   Gastroesophageal reflux disease without esophagitis 01/10/2021   Dizziness 01/10/2021   5:12 PM, 05/15/21 Josue Hector PT DPT  Physical Therapist with [facility]  [phone] [place] [place], [place], [address] Phone: [phone]   Fax:  [phone]  Name: Richard Velez MRN: [id number] Date of Birth: 29-Dec-1955

## 2021-05-15 NOTE — Patient Instructions (Signed)
Access Code: [email or web address] URL: [email or web address] Date: 05/15/2021 Prepared by: Josue Hector  Exercises Seated Horizontal Saccades - 3 x daily - 7 x weekly - 3 sets - 10 reps Seated Gaze Stabilization with Head Rotation - 3 x daily - 7 x weekly - 3 sets - 10 reps

## 2021-05-24 ENCOUNTER — Ambulatory Visit (HOSPITAL_COMMUNITY): Payer: Medicare Other | Admitting: Physical Therapy

## 2021-05-24 ENCOUNTER — Other Ambulatory Visit: Payer: Self-pay

## 2021-05-24 DIAGNOSIS — R42 Dizziness and giddiness: Secondary | ICD-10-CM | POA: Diagnosis not present

## 2021-05-24 NOTE — Therapy (Signed)
[place], [place], [address] Phone: [phone]   Fax:  [phone]  Physical Therapy Treatment  Patient Details  Name: Richard Velez MRN: [id number] Date of Birth: 22-Jan-1956 Referring Provider (PT): [name] FNP   Encounter Date: 05/24/2021   PT End of Session - 05/24/21 1547     Visit Number 2    Number of Visits 4    Date for PT Re-Evaluation 06/12/21    Authorization Type UHC MEdicare/ Medicaid 2ndary    PT Start Time 1445    PT Stop Time 1535    PT Time Calculation (min) 50 min    Activity Tolerance Patient tolerated treatment well    Behavior During Therapy [facility] for tasks assessed/performed             Past Medical History:  Diagnosis Date   Aortic atherosclerosis ([place]) 02/19/2021   Cataract    Emphysema lung ([place]) 02/19/2021   Enlarged prostate 01/10/2021   Essential hypertension 01/10/2021   Gastroesophageal reflux disease 01/10/2021   History of kidney stones    Lung nodules    Benign in appearance on low-dose lung cancer screening CT on 02/19/2021   Mixed hyperlipidemia 01/11/2021    Past Surgical History:  Procedure Laterality Date   BIOPSY  02/27/2021   Procedure: BIOPSY;  Surgeon: [name], MD;  [facility];  Service: Endoscopy;;  gastric esophageal   COLONOSCOPY N/A 02/27/2021   Two 5 to 6 mm polyps at the ileocecal valve (tubular adenomas), examination was otherwise normal.   ESOPHAGOGASTRODUODENOSCOPY N/A 02/27/2021   Distal esophageal stenosis/stricture.-**Dilated**   EYE SURGERY Bilateral    cataract   MALONEY DILATION N/A 02/27/2021   Procedure: Venia Minks DILATION;  Surgeon: [name], MD;  [facility];  Service: Endoscopy;  Laterality: N/A;   POLYPECTOMY  02/27/2021   Procedure: POLYPECTOMY;  Surgeon: [name], MD;  [facility];  Service: Endoscopy;;  colon    There were no vitals filed for this visit.    Subjective Assessment - 05/24/21 1450     Subjective PT states that he feels a pressure in his ears, states that his ear wax is thinner than normal.    Currently in Pain? No/denies                [facility] PT Assessment - 05/24/21 0001       Functional Tests   Functional tests Single leg stance      Single Leg Stance   Comments RT: 60" Lt 60 "                 Vestibular Assessment - 05/24/21 0001       Positional Testing   Dix-Hallpike Dix-Hallpike Right;Dix-Hallpike Left    Sidelying Test Sidelying Right;Sidelying Left    Horizontal Canal Testing Horizontal Canal Right;Horizontal Canal Left      Dix-Hallpike Right   Dix-Hallpike Right Symptoms No nystagmus      Dix-Hallpike Left   Dix-Hallpike Left Symptoms No nystagmus      Sidelying Right   Sidelying Right Symptoms No nystagmus      Sidelying Left   Sidelying Left Symptoms No nystagmus      Horizontal Canal Right   Horizontal Canal Right Symptoms Normal      Horizontal Canal Left   Horizontal Canal Left Symptoms Normal      Positional Sensitivities   Sit to Supine No dizziness  Supine to Left Side No dizziness    Supine to Right Side No dizziness    Supine to Sitting No dizziness    Up from Right Hallpike Lightheadedness    Up from Left Hallpike No dizziness    Nose to Right Knee No dizziness    Nose to Left Knee No dizziness    Head Turning x 5 No dizziness    Head Nodding x 5 Lightheadedness      Orthostatics   BP sitting 168/98    HR sitting 87   BP taken a second time: reading 162/92; then taken in Rt UE 157\96; 2nd time 154/91; manual 150/85                     [facility] Adult PT Treatment/Exercise - 05/24/21 0001       Exercises   Exercises Neck      Neck Exercises: Seated   Other Seated Exercise forward bend wait til sx go away then come back up x 10             Vestibular Treatment/Exercise - 05/24/21 0001       Vestibular Treatment/Exercise   Habituation  Exercises Seated Vertical Head Turns      Seated Vertical Head Turns   Number of Reps  10    Symptom Description  mild dizziness                   PT Education - 05/24/21 1551     Education Details HEP ; elevation of BP to start monitoring BP when pt feels dizzy and that if we can not find more positive clinical findings we may D/C and have pt follow back up with his MD>    Person(s) Educated Patient    Methods Explanation    Comprehension Verbalized understanding              PT Short Term Goals - 05/24/21 1552       PT SHORT TERM GOAL #1   Title Patient will be independant in initial HEP for improved functional outcomes and reduced dizziness    Time 2    Period Weeks    Status On-going    Target Date 05/29/21      PT SHORT TERM GOAL #2   Title Patient will report at least 50% resolulion of dizziness for improved funcitonal ability and reduced risk for falls.    Time 2    Period Weeks    Status On-going    Target Date 06/12/21               PT Long Term Goals - 05/24/21 1553       PT LONG TERM GOAL #1   Title Patient will improve FOTO score to predicted value for improved funcitonal outcomes and QOL    Time 4    Period Weeks    Status On-going      PT LONG TERM GOAL #2   Title Patient will report at least 75% resolution of vertigo symptoms for improved funcitonal outcomes and reduced risk for falls.    Time 4    Period Weeks    Status On-going                   Plan - 05/24/21 1548     Clinical Impression Statement Eval and goals reviewed with pt.  Therapist explained that evaluation does not indicate BPPV.  Pt BP taken multiple times with BP  being elevated.  PT has a monitor at home.  Therapist asked pt to keep a log and when he is feeling dizzy document his BP.  PT has no balance or ROM deficit.    Examination-Activity Limitations Locomotion Level;Transfers;Lift;Bend    Examination-Participation Restrictions Community  Activity;Driving;Cleaning    Stability/Clinical Decision Making Stable/Uncomplicated    Rehab Potential Good    PT Frequency 1x / week    PT Duration 4 weeks    PT Treatment/Interventions ADLs/Self Care Home Management;Canalith Repostioning;Patient/family education;Therapeutic exercise;Therapeutic activities;Functional mobility training;Neuromuscular re-education;Stair training;Gait training;Moist Heat;Traction;Balance training;Vestibular;Passive range of motion;Dry needling;Manual techniques;Spinal Manipulations;Visual/perceptual remediation/compensation    PT Next Visit Plan Sit to stand wiht head turns, [employer], VOR and Saccades standing, high level vestibular treatments    PT Home Exercise Plan Eval: VOR 1, Saccades    Consulted and Agree with Plan of Care Patient             Patient will benefit from skilled therapeutic intervention in order to improve the following deficits and impairments:  Dizziness, Decreased activity tolerance  Visit Diagnosis: Dizziness and giddiness     Problem List Patient Active Problem List   Diagnosis Date Noted   Lip lesion 02/24/2021   Emphysema lung ([place]) 02/19/2021   Aortic atherosclerosis ([place]) 02/19/2021   Dysphagia 01/15/2021   Abnormal CT of the abdomen 01/15/2021   Mixed hyperlipidemia 01/11/2021   Essential hypertension 01/10/2021   Lung nodule 01/10/2021   Tobacco use 01/10/2021   Enlarged prostate 01/10/2021   Esophageal dysphagia 01/10/2021   Gastroesophageal reflux disease without esophagitis 01/10/2021   Dizziness 01/10/2021   Rayetta Humphrey, PT CLT [phone]  05/24/2021, 3:55 PM  [place], [place], [address] Phone: [phone]   Fax:  [phone]  Name: Richard Velez MRN: [id number] Date of Birth: 12/04/1955

## 2021-05-29 ENCOUNTER — Ambulatory Visit (HOSPITAL_COMMUNITY): Payer: Medicare Other | Admitting: Physical Therapy

## 2021-05-29 ENCOUNTER — Other Ambulatory Visit: Payer: Self-pay

## 2021-05-29 DIAGNOSIS — R42 Dizziness and giddiness: Secondary | ICD-10-CM

## 2021-05-29 NOTE — Therapy (Signed)
[place], [place], [address] Phone: [phone]   Fax:  [phone]  Physical Therapy Treatment  Patient Details  Name: Richard Velez MRN: [id number] Date of Birth: [birth year] Referring Provider (PT): [name] FNP   Encounter Date: 05/29/2021   PT End of Session - 05/29/21 1016     Visit Number 3    Number of Visits 4    Date for PT Re-Evaluation 06/12/21    Authorization Type UHC MEdicare/ Medicaid 2ndary    PT Start Time 1000    PT Stop Time 1040    PT Time Calculation (min) 40 min             Past Medical History:  Diagnosis Date   Aortic atherosclerosis ([place]) 02/19/2021   Cataract    Emphysema lung ([place]) 02/19/2021   Enlarged prostate 01/10/2021   Essential hypertension 01/10/2021   Gastroesophageal reflux disease 01/10/2021   History of kidney stones    Lung nodules    Benign in appearance on low-dose lung cancer screening CT on 02/19/2021   Mixed hyperlipidemia 01/11/2021    Past Surgical History:  Procedure Laterality Date   BIOPSY  02/27/2021   Procedure: BIOPSY;  Surgeon: [name], MD;  [facility];  Service: Endoscopy;;  gastric esophageal   COLONOSCOPY N/A 02/27/2021   Two 5 to 6 mm polyps at the ileocecal valve (tubular adenomas), examination was otherwise normal.   ESOPHAGOGASTRODUODENOSCOPY N/A 02/27/2021   Distal esophageal stenosis/stricture.-**Dilated**   EYE SURGERY Bilateral    cataract   MALONEY DILATION N/A 02/27/2021   Procedure: Venia Minks DILATION;  Surgeon: [name], MD;  [facility];  Service: Endoscopy;  Laterality: N/A;   POLYPECTOMY  02/27/2021   Procedure: POLYPECTOMY;  Surgeon: [name], MD;  [facility];  Service: Endoscopy;;  colon    There were no vitals filed for this visit.   Subjective Assessment - 05/29/21 1004     Subjective Pt states that he did not write his BP down when he was dizzy.  He  had a rough couple of days. He felt bad all over until yesterday.    Currently in Pain? No/denies                     Vestibular Assessment - 05/29/21 0001       Orthostatics   BP sitting 159/80    BP standing (after 1 minute) 157/80                      [facility] Adult PT Treatment/Exercise - 05/29/21 0001       Exercises   Exercises Neck      Neck Exercises: Seated   Neck Retraction 10 reps    Neck Retraction Limitations scapular retraction x 10    Cervical Rotation 15 reps             Vestibular Treatment/Exercise - 05/29/21 0001       Vestibular Treatment/Exercise   Habituation Exercises Standing Horizontal Head Turns;Standing Vertical Head Turns      Brandt Daroff   Number of Reps  5    Symptom Description  no dizziness      Standing Horizontal Head Turns   Number of Reps  5   x2 reps   Symptom Description  no change to the RT; pressure to the left.      Standing Vertical Head Turns  Number of Reps  5   x2   Symptom Description  feels pressure in the back of his head             manual pettrissage and pressure point to spasm.      PT Education - 05/29/21 1043     Education Details HEP    Person(s) Educated Patient    Methods Explanation    Comprehension Verbalized understanding              PT Short Term Goals - 05/24/21 1552       PT SHORT TERM GOAL #1   Title Patient will be independant in initial HEP for improved functional outcomes and reduced dizziness    Time 2    Period Weeks    Status On-going    Target Date 05/29/21      PT SHORT TERM GOAL #2   Title Patient will report at least 50% resolulion of dizziness for improved funcitonal ability and reduced risk for falls.    Time 2    Period Weeks    Status On-going    Target Date 06/12/21               PT Long Term Goals - 05/24/21 1553       PT LONG TERM GOAL #1   Title Patient will improve FOTO score to predicted value for improved  funcitonal outcomes and QOL    Time 4    Period Weeks    Status On-going      PT LONG TERM GOAL #2   Title Patient will report at least 75% resolution of vertigo symptoms for improved funcitonal outcomes and reduced risk for falls.    Time 4    Period Weeks    Status On-going                   Plan - 05/29/21 1043     Clinical Impression Statement BP 159/90; no increased sx with Nestor Lewandowsky.  Pt c/0 of pressure but not dizziness with head turns and nods. Noted mm spasm in B Upper trap with manual.  Will give habituation exercises for home to see if this assists in decreasing his sx.    Examination-Activity Limitations Locomotion Level;Transfers;Lift;Bend    Examination-Participation Restrictions Community Activity;Driving;Cleaning    Stability/Clinical Decision Making Stable/Uncomplicated    Rehab Potential Good    PT Frequency 1x / week    PT Duration 4 weeks    PT Treatment/Interventions ADLs/Self Care Home Management;Canalith Repostioning;Patient/family education;Therapeutic exercise;Therapeutic activities;Functional mobility training;Neuromuscular re-education;Stair training;Gait training;Moist Heat;Traction;Balance training;Vestibular;Passive range of motion;Dry needling;Manual techniques;Spinal Manipulations;Visual/perceptual remediation/compensation    PT Next Visit Plan VOR and Saccades standing, high level vestibular treatments    PT Home Exercise Plan Eval: VOR 1, Saccades: 7/27 sitting cervical and scapular retraction, sitting and standing Lt head rotation and head nods.    Consulted and Agree with Plan of Care Patient             Patient will benefit from skilled therapeutic intervention in order to improve the following deficits and impairments:  Dizziness, Decreased activity tolerance  Visit Diagnosis: Dizziness and giddiness     Problem List Patient Active Problem List   Diagnosis Date Noted   Lip lesion 02/24/2021   Emphysema lung ([place])  02/19/2021   Aortic atherosclerosis ([place]) 02/19/2021   Dysphagia 01/15/2021   Abnormal CT of the abdomen 01/15/2021   Mixed hyperlipidemia 01/11/2021   Essential hypertension 01/10/2021   Lung  nodule 01/10/2021   Tobacco use 01/10/2021   Enlarged prostate 01/10/2021   Esophageal dysphagia 01/10/2021   Gastroesophageal reflux disease without esophagitis 01/10/2021   Dizziness 01/10/2021   Rayetta Humphrey, PT CLT [phone]  05/29/2021, 10:48 AM  [place], [place], [address] Phone: [phone]   Fax:  [phone]  Name: Richard Velez MRN: [id number] Date of Birth: 09/16/1956

## 2021-06-05 ENCOUNTER — Ambulatory Visit (HOSPITAL_COMMUNITY): Payer: Medicare Other | Attending: Family Medicine | Admitting: Physical Therapy

## 2021-06-05 ENCOUNTER — Other Ambulatory Visit: Payer: Self-pay

## 2021-06-05 ENCOUNTER — Encounter (HOSPITAL_COMMUNITY): Payer: Self-pay | Admitting: Physical Therapy

## 2021-06-05 DIAGNOSIS — R42 Dizziness and giddiness: Secondary | ICD-10-CM | POA: Diagnosis present

## 2021-06-05 NOTE — Therapy (Signed)
[place], [place], [address] Phone: [phone]   Fax:  [phone]  Physical Therapy Treatment  Patient Details  Name: Richard Velez MRN: [id number] Date of Birth: Nov 21, 1955 Referring Provider (PT): [name] FNP  PHYSICAL THERAPY DISCHARGE SUMMARY  Visits from Start of Care: 4  Current functional level related to goals / functional outcomes: See below    Remaining deficits: See below    Education / Equipment: See assessment   Patient agrees to discharge. Patient goals were met. Patient is being discharged due to being pleased with the current functional level.  Encounter Date: 06/05/2021   PT End of Session - 06/05/21 1700     Visit Number 4    Number of Visits 4    Date for PT Re-Evaluation 06/12/21    Authorization Type [employer] MEdicare/ Medicaid 2ndary    PT Start Time 1648    PT Stop Time 1726    PT Time Calculation (min) 38 min             Past Medical History:  Diagnosis Date   Aortic atherosclerosis ([place]) 02/19/2021   Cataract    Emphysema lung ([place]) 02/19/2021   Enlarged prostate 01/10/2021   Essential hypertension 01/10/2021   Gastroesophageal reflux disease 01/10/2021   History of kidney stones    Lung nodules    Benign in appearance on low-dose lung cancer screening CT on 02/19/2021   Mixed hyperlipidemia 01/11/2021    Past Surgical History:  Procedure Laterality Date   BIOPSY  02/27/2021   Procedure: BIOPSY;  Surgeon: [name], MD;  [facility];  Service: Endoscopy;;  gastric esophageal   COLONOSCOPY N/A 02/27/2021   Two 5 to 6 mm polyps at the ileocecal valve (tubular adenomas), examination was otherwise normal.   ESOPHAGOGASTRODUODENOSCOPY N/A 02/27/2021   Distal esophageal stenosis/stricture.-**Dilated**   EYE SURGERY Bilateral    cataract   MALONEY DILATION N/A 02/27/2021   Procedure: Venia Minks DILATION;  Surgeon: [name], MD;  [facility];  Service: Endoscopy;  Laterality: N/A;   POLYPECTOMY  02/27/2021   Procedure: POLYPECTOMY;  Surgeon: [name], MD;  [facility];  Service: Endoscopy;;  colon    There were no vitals filed for this visit.   Subjective Assessment - 06/05/21 1657     Subjective Patient says he feels a little better. Not having dizziness really, more lightheadedness that comes and goes. Feels this is related to some of his "levels being off". Has been tracking BP more. Says this is staying at a good level. Felt neck message was helpful for this feeling last time.    Currently in Pain? No/denies                [facility] PT Assessment - 06/05/21 0001       Assessment   Medical Diagnosis Vertigo    Referring Provider (PT) [name] FNP      Cognition   Overall Cognitive Status Within Functional Limits for tasks assessed      Observation/Other Assessments   Focus on Therapeutic Outcomes (FOTO)  56% function   was 49%                                    PT Short Term Goals - 06/05/21 1708       PT SHORT TERM GOAL #1  Title Patient will be independant in initial HEP for improved functional outcomes and reduced dizziness    Baseline Reports compliance    Time 2    Period Weeks    Status Achieved    Target Date 05/29/21      PT SHORT TERM GOAL #2   Title Patient will report at least 50% resolulion of dizziness for improved funcitonal ability and reduced risk for falls.    Baseline Reports 80% improvement    Time 2    Period Weeks    Status Achieved    Target Date 06/12/21               PT Long Term Goals - 06/05/21 1709       PT LONG TERM GOAL #1   Title Patient will improve FOTO score to predicted value for improved funcitonal outcomes and QOL    Baseline current 56% function    Time 4    Period Weeks    Status Achieved      PT LONG TERM GOAL #2   Title Patient will report at least 75% resolution of vertigo symptoms  for improved funcitonal outcomes and reduced risk for falls.    Baseline Reports 80% improvement    Time 4    Period Weeks    Status Achieved                   Plan - 06/05/21 1734     Clinical Impression Statement Patient has met all therapy goals. Patient reports 80% resolution of vertigo symptoms and has not had any recent episodes of dizziness. He does report ongoing intermittent lightheadedness but feels this is related to fluctuations on BP which he has recently increased monitoring. At this time, patient will be DC from therapy with all goals met. Reviewed HEP and activity modification to encourage increased activity/ walking program for improved circulation and conditioning to reduce remaining symptoms. Also encouraged patient to follow up with PCP regarding this issue, he reports having scheduled visit in about 2-3 months. Answered all patient questions. Encouraged patient to follow up with therapy services with any further questions or concerns.    Examination-Activity Limitations Locomotion Level;Transfers;Lift;Bend    Examination-Participation Restrictions Community Activity;Driving;Cleaning    Stability/Clinical Decision Making Stable/Uncomplicated    Rehab Potential Good    PT Treatment/Interventions ADLs/Self Care Home Management;Canalith Repostioning;Patient/family education;Therapeutic exercise;Therapeutic activities;Functional mobility training;Neuromuscular re-education;Stair training;Gait training;Moist Heat;Traction;Balance training;Vestibular;Passive range of motion;Dry needling;Manual techniques;Spinal Manipulations;Visual/perceptual remediation/compensation    PT Next Visit Plan DC to HEP    PT Home Exercise Plan Eval: VOR 1, Saccades: 7/27 sitting cervical and scapular retraction, sitting and standing Lt head rotation and head nods.    Consulted and Agree with Plan of Care Patient             Patient will benefit from skilled therapeutic intervention in  order to improve the following deficits and impairments:  Dizziness, Decreased activity tolerance  Visit Diagnosis: Dizziness and giddiness     Problem List Patient Active Problem List   Diagnosis Date Noted   Lip lesion 02/24/2021   Emphysema lung ([place]) 02/19/2021   Aortic atherosclerosis ([place]) 02/19/2021   Dysphagia 01/15/2021   Abnormal CT of the abdomen 01/15/2021   Mixed hyperlipidemia 01/11/2021   Essential hypertension 01/10/2021   Lung nodule 01/10/2021   Tobacco use 01/10/2021   Enlarged prostate 01/10/2021   Esophageal dysphagia 01/10/2021   Gastroesophageal reflux disease without esophagitis 01/10/2021   Dizziness 01/10/2021   5:36  PM, 06/05/21 Josue Hector PT DPT  Physical Therapist with [facility]  [phone] [place] [address] [place], [place], [address] Phone: [phone]   Fax:  [phone]  Name: Richard Velez MRN: [id number] Date of Birth: 06-18-1956

## 2021-06-12 ENCOUNTER — Ambulatory Visit (HOSPITAL_COMMUNITY): Payer: Medicare Other | Admitting: Physical Therapy

## 2021-07-01 ENCOUNTER — Telehealth: Payer: Self-pay | Admitting: Family Medicine

## 2021-07-01 DIAGNOSIS — I10 Essential (primary) hypertension: Secondary | ICD-10-CM

## 2021-07-01 MED ORDER — LOSARTAN POTASSIUM 100 MG PO TABS: 100.0000 mg | ORAL_TABLET | Freq: Every day | ORAL | 2 refills | Status: DC

## 2021-07-01 NOTE — Telephone Encounter (Signed)
Pt aware refill sent to [facility]

## 2021-07-01 NOTE — Telephone Encounter (Signed)
  Prescription Request  07/01/2021   What is the name of the medication or equipment? Losartan  Have you contacted your pharmacy to request a refill? Yes  Which pharmacy would you like this sent to? [place]

## 2021-08-20 ENCOUNTER — Ambulatory Visit (INDEPENDENT_AMBULATORY_CARE_PROVIDER_SITE_OTHER): Payer: Medicare Other | Admitting: Gastroenterology

## 2021-08-20 ENCOUNTER — Encounter: Payer: Self-pay | Admitting: Gastroenterology

## 2021-08-20 ENCOUNTER — Encounter: Payer: Self-pay | Admitting: *Deleted

## 2021-08-20 ENCOUNTER — Other Ambulatory Visit: Payer: Self-pay

## 2021-08-20 VITALS — BP 151/83 | HR 76 | Temp 97.7°F | Ht 68.0 in | Wt 179.2 lb

## 2021-08-20 DIAGNOSIS — R1310 Dysphagia, unspecified: Secondary | ICD-10-CM

## 2021-08-20 NOTE — Patient Instructions (Signed)
Continue omeprazole twice a day, 30 minutes before breakfast and dinner.  I am ordered a special xray of your esophagus. If needed, we will do an upper endoscopy with dilation again.  Further recommendations to follow!  It was a pleasure to see you today. I want to create trusting relationships with patients to provide genuine, compassionate, and quality care. I value your feedback. If you receive a survey regarding your visit,  I greatly appreciate you taking time to fill this out.   Annitta Needs, PhD, ANP-BC [facility]

## 2021-08-20 NOTE — Progress Notes (Addendum)
Referring Provider: Loman Brooklyn, FNP Primary Care Physician:  Loman Brooklyn, FNP Primary GI: Dr. Gala Romney  Chief Complaint  Patient presents with   Dysphagia    Has to eat something to help meds go down   Gastroesophageal Reflux    Med helps    HPI:   Richard Velez is a 65 y.o. male presenting today with a history of GERD, dysphagia, adenomas with next colonoscopy in 2027. Recent EGD with dilation in April 2022 with stricture s/p dilation. He had noted persistent dysphagia at last visit but declined BPE.   Returns today noting aspirin dissolving in his chest after it gets hung up. Starts burning. Has to eat food to help propel down. Still with solid food dysphagia. Stopped pepsi. Taking omeprazole BID. No significant improvement from prior EGD with dysphagia. However, GERD symptoms are improved on omeprazole BID.   Past Medical History:  Diagnosis Date   Aortic atherosclerosis ([place]) 02/19/2021   Cataract    Emphysema lung ([place]) 02/19/2021   Enlarged prostate 01/10/2021   Essential hypertension 01/10/2021   Gastroesophageal reflux disease 01/10/2021   History of kidney stones    Lung nodules    Benign in appearance on low-dose lung cancer screening CT on 02/19/2021   Mixed hyperlipidemia 01/11/2021    Past Surgical History:  Procedure Laterality Date   BIOPSY  02/27/2021   Procedure: BIOPSY;  Surgeon: [name], MD;  [facility];  Service: Endoscopy;;  gastric esophageal   COLONOSCOPY N/A 02/27/2021   Two 5 to 6 mm polyps at the ileocecal valve (tubular adenomas), examination was otherwise normal.   ESOPHAGOGASTRODUODENOSCOPY N/A 02/27/2021   Distal esophageal stenosis/stricture.-**Dilated**   EYE SURGERY Bilateral    cataract   MALONEY DILATION N/A 02/27/2021   Procedure: Venia Minks DILATION;  Surgeon: [name], MD;  [facility];  Service: Endoscopy;  Laterality: N/A;   POLYPECTOMY  02/27/2021   Procedure: POLYPECTOMY;  Surgeon:  [name], MD;  [facility];  Service: Endoscopy;;  colon    Current Outpatient Medications  Medication Sig Dispense Refill   albuterol (VENTOLIN HFA) 108 (90 Base) MCG/ACT inhaler Inhale 2 puffs into the lungs every 6 (six) hours as needed. 18 g 2   aspirin 81 MG EC tablet Take 1 tablet (81 mg total) by mouth daily. Swallow whole. 30 tablet 12   atorvastatin (LIPITOR) 10 MG tablet Take 1 tablet (10 mg total) by mouth daily. 90 tablet 1   losartan (COZAAR) 100 MG tablet Take 1 tablet (100 mg total) by mouth daily. 30 tablet 2   meclizine (ANTIVERT) 25 MG tablet Take 1 tablet (25 mg total) by mouth 3 (three) times daily as needed for dizziness. 90 tablet 3   omeprazole (PRILOSEC) 40 MG capsule Take 1 capsule by mouth 2 (two) times daily.     No current facility-administered medications for this visit.    Allergies as of 08/20/2021 - Review Complete 08/20/2021  Allergen Reaction Noted   Lisinopril Cough 02/21/2021   Oxycodone-acetaminophen Itching and Nausea Only 12/17/2017    Family History  Problem Relation Age of Onset   Hypertension Mother    Diabetes Mother    Heart attack Father    Diabetes Sister    Diabetes Brother    Colon cancer Neg Hx    Gastric cancer Neg Hx    Esophageal cancer Neg Hx     Social History   Socioeconomic History   Marital status:  Married    Spouse name: Not on file   Number of children: Not on file   Years of education: Not on file   Highest education level: Not on file  Occupational History   Occupation: Retired    Comment: Delivered ice  Tobacco Use   Smoking status: Every Day    Packs/day: 1.00    Types: Cigarettes    Start date: 01/11/1971   Smokeless tobacco: Never  Vaping Use   Vaping Use: Never used  Substance and Sexual Activity   Alcohol use: Yes    Comment: occ   Drug use: Never   Sexual activity: Not on file  Other Topics Concern   Not on file  Social History Narrative   Not on file   Social  Determinants of Health   Financial Resource Strain: Not on file  Food Insecurity: Not on file  Transportation Needs: Not on file  Physical Activity: Not on file  Stress: Not on file  Social Connections: Not on file    Review of Systems: Gen: Denies fever, chills, anorexia. Denies fatigue, weakness, weight loss.  CV: Denies chest pain, palpitations, syncope, peripheral edema, and claudication. Resp: Denies dyspnea at rest, cough, wheezing, coughing up blood, and pleurisy. GI: see HPI Derm: Denies rash, itching, dry skin Psych: Denies depression, anxiety, memory loss, confusion. No homicidal or suicidal ideation.  Heme: Denies bruising, bleeding, and enlarged lymph nodes.  Physical Exam: BP (!) 151/83   Pulse 76   Temp 97.7 F (36.5 C) (Temporal)   Ht 5\' 8"  (1.727 m)   Wt 179 lb 3.2 oz (81.3 kg)   BMI 27.25 kg/m  General:   Alert and oriented. No distress noted. Pleasant and cooperative.  Head:  Normocephalic and atraumatic. Eyes:  Conjuctiva clear without scleral icterus. Mouth:  mask in place Abdomen:  +BS, soft, non-tender and non-distended. No rebound or guarding. No HSM or masses noted. Msk:  Symmetrical without gross deformities. Normal posture. Extremities:  Without edema. Neurologic:  Alert and  oriented x4 Psych:  Alert and cooperative. Normal mood and affect.  ASSESSMENT: Richard Velez is a 65 y.o. male presenting today with a history of GERD, dysphagia, adenomas with next colonoscopy in 2027. Recent EGD with dilation in April 2022 with stricture s/p dilation. He had noted persistent dysphagia at last visit but declined BPE.   Returning today with persistent dysphagia, both solid and liquid. GERD symptoms improved on omeprazole BID. Concern for motility disorder vs persistent stricture.  Discussed with patient that we would pursue BPE first. May ultimately need repeat EGD/dilation.    PLAN:  Continue omeprazole BID  BPE in near future  Further  recommendations to follow   Annitta Needs, PhD, ANP-BC [facility]    ADDENDUM 11/2: BPE with moderate esophageal stricture without passage of barium tablet. Pursuing EGD/dilation with Dr. Gala Romney using Propofol. ASA 3. Risks and benefits discussed. Annitta Needs, PhD, ANP-BC [facility]

## 2021-08-21 ENCOUNTER — Telehealth: Payer: Self-pay | Admitting: *Deleted

## 2021-08-21 NOTE — Telephone Encounter (Signed)
Called pt and he is aware of BPE appt details. He voiced understanding.

## 2021-08-23 ENCOUNTER — Ambulatory Visit (HOSPITAL_COMMUNITY)
Admission: RE | Admit: 2021-08-23 | Discharge: 2021-08-23 | Disposition: A | Discharge status: Home / Self Care | Payer: Medicare Other | Source: Ambulatory Visit | Attending: Gastroenterology | Admitting: Gastroenterology

## 2021-08-23 ENCOUNTER — Other Ambulatory Visit: Payer: Self-pay

## 2021-08-23 DIAGNOSIS — R1310 Dysphagia, unspecified: Secondary | ICD-10-CM | POA: Insufficient documentation

## 2021-08-27 ENCOUNTER — Other Ambulatory Visit: Payer: Self-pay

## 2021-08-27 ENCOUNTER — Other Ambulatory Visit: Payer: Self-pay | Admitting: Internal Medicine

## 2021-08-28 ENCOUNTER — Telehealth: Payer: Self-pay

## 2021-08-28 NOTE — Telephone Encounter (Signed)
Called and informed pt of pre-op appt 09/10/21 at 1:15pm. Appt letter mailed with procedure instructions.

## 2021-09-03 ENCOUNTER — Telehealth: Payer: Self-pay | Admitting: Internal Medicine

## 2021-09-03 NOTE — Telephone Encounter (Signed)
Pt said he had already called his pharmacy and he was following up on getting a refill on his Omeprazole. He uses [facility].

## 2021-09-03 NOTE — Telephone Encounter (Signed)
Returned the pt's call and advised the pt his Rx was filled 08/29/21 by Neil Crouch with 5 additional refills. Pt stated he had just found out after calling us.

## 2021-09-06 NOTE — Patient Instructions (Signed)
Richard Velez  09/06/2021     @PREFPERIOPPHARMACY @   Your procedure is scheduled on  09/12/2021.   Report to [facility] at  1315 (1:15) P.M.   Call this number if you have problems the morning of surgery:  [phone]   Remember:  Follow the diet instructions given to you by the office.       Use your inhaler before you come and bring your rescue inhaler with you.    Take these medicines the morning of surgery with A SIP OF WATER                antivert(if needed), prilosec.     Do not wear jewelry, make-up or nail polish.  Do not wear lotions, powders, or perfumes, or deodorant.  Do not shave 48 hours prior to surgery.  Men may shave face and neck.  Do not bring valuables to the hospital.  [facility] is not responsible for any belongings or valuables.  Contacts, dentures or bridgework may not be worn into surgery.  Leave your suitcase in the car.  After surgery it may be brought to your room.  For patients admitted to the hospital, discharge time will be determined by your treatment team.  Patients discharged the day of surgery will not be allowed to drive home and must have someone with them for 24 hours.    Special instructions:   DO NOT smoke tobacco or vape for 24 hours before your procedure.  Please read over the following fact sheets that you were given. Anesthesia Post-op Instructions and Care and Recovery After Surgery      Upper Endoscopy, Adult, Care After This sheet gives you information about how to care for yourself after your procedure. Your health care provider may also give you more specific instructions. If you have problems or questions, contact your health care provider. What can I expect after the procedure? After the procedure, it is common to have: A sore throat. Mild stomach pain or discomfort. Bloating. Nausea. Follow these instructions at home:  Follow instructions from your health care provider about what to eat or drink  after your procedure. Return to your normal activities as told by your health care provider. Ask your health care provider what activities are safe for you. Take over-the-counter and prescription medicines only as told by your health care provider. If you were given a sedative during the procedure, it can affect you for several hours. Do not drive or operate machinery until your health care provider says that it is safe. Keep all follow-up visits as told by your health care provider. This is important. Contact a health care provider if you have: A sore throat that lasts longer than one day. Trouble swallowing. Get help right away if: You vomit blood or your vomit looks like coffee grounds. You have: A fever. Bloody, black, or tarry stools. A severe sore throat or you cannot swallow. Difficulty breathing. Severe pain in your chest or abdomen. Summary After the procedure, it is common to have a sore throat, mild stomach discomfort, bloating, and nausea. If you were given a sedative during the procedure, it can affect you for several hours. Do not drive or operate machinery until your health care provider says that it is safe. Follow instructions from your health care provider about what to eat or drink after your procedure. Return to your normal activities as told by your health care provider. This information is not intended to  replace advice given to you by your health care provider. Make sure you discuss any questions you have with your health care provider. Document Revised: 08/26/2019 Document Reviewed: 03/22/2018 Elsevier Patient Education  2022 [employer]. Esophageal Dilatation Esophageal dilatation, also called esophageal dilation, is a procedure to widen or open a blocked or narrowed part of the esophagus. The esophagus is the part of the body that moves food and liquid from the mouth to the stomach. You may need this procedure if: You have a buildup of scar tissue in your  esophagus that makes it difficult, painful, or impossible to swallow. This can be caused by gastroesophageal reflux disease (GERD). You have cancer of the esophagus. There is a problem with how food moves through your esophagus. In some cases, you may need this procedure repeated at a later time to dilate the esophagus gradually. Tell a health care provider about: Any allergies you have. All medicines you are taking, including vitamins, herbs, eye drops, creams, and over-the-counter medicines. Any problems you or family members have had with anesthetic medicines. Any blood disorders you have. Any surgeries you have had. Any medical conditions you have. Any antibiotic medicines you are required to take before dental procedures. Whether you are pregnant or may be pregnant. What are the risks? Generally, this is a safe procedure. However, problems may occur, including: Bleeding due to a tear in the lining of the esophagus. A hole, or perforation, in the esophagus. What happens before the procedure? Ask your health care provider about: Changing or stopping your regular medicines. This is especially important if you are taking diabetes medicines or blood thinners. Taking medicines such as aspirin and ibuprofen. These medicines can thin your blood. Do not take these medicines unless your health care provider tells you to take them. Taking over-the-counter medicines, vitamins, herbs, and supplements. Follow instructions from your health care provider about eating or drinking restrictions. Plan to have a responsible adult take you home from the hospital or clinic. Plan to have a responsible adult care for you for the time you are told after you leave the hospital or clinic. This is important. What happens during the procedure? You may be given a medicine to help you relax (sedative). A numbing medicine may be sprayed into the back of your throat, or you may gargle the medicine. Your health care  provider may perform the dilatation using various surgical instruments, such as: Simple dilators. This instrument is carefully placed in the esophagus to stretch it. Guided wire bougies. This involves using an endoscope to insert a wire into the esophagus. A dilator is passed over this wire to enlarge the esophagus. Then the wire is removed. Balloon dilators. An endoscope with a small balloon is inserted into the esophagus. The balloon is inflated to stretch the esophagus and open it up. The procedure may vary among health care providers and hospitals. What can I expect after the procedure? Your blood pressure, heart rate, breathing rate, and blood oxygen level will be monitored until you leave the hospital or clinic. Your throat may feel slightly sore and numb. This will get better over time. You will not be allowed to eat or drink until your throat is no longer numb. When you are able to drink, urinate, and sit on the edge of the bed without nausea or dizziness, you may be able to return home. Follow these instructions at home: Take over-the-counter and prescription medicines only as told by your health care provider. If you were given  a sedative during the procedure, it can affect you for several hours. Do not drive or operate machinery until your health care provider says that it is safe. Plan to have a responsible adult care for you for the time you are told. This is important. Follow instructions from your health care provider about any eating or drinking restrictions. Do not use any products that contain nicotine or tobacco, such as cigarettes, e-cigarettes, and chewing tobacco. If you need help quitting, ask your health care provider. Keep all follow-up visits. This is important. Contact a health care provider if: You have a fever. You have pain that is not relieved by medicine. Get help right away if: You have chest pain. You have trouble breathing. You have trouble swallowing. You  vomit blood. You have black, tarry, or bloody stools. These symptoms may represent a serious problem that is an emergency. Do not wait to see if the symptoms will go away. Get medical help right away. Call your local emergency services (911 in the [place] Do not drive yourself to the hospital. Summary Esophageal dilatation, also called esophageal dilation, is a procedure to widen or open a blocked or narrowed part of the esophagus. Plan to have a responsible adult take you home from the hospital or clinic. For this procedure, a numbing medicine may be sprayed into the back of your throat, or you may gargle the medicine. Do not drive or operate machinery until your health care provider says that it is safe. This information is not intended to replace advice given to you by your health care provider. Make sure you discuss any questions you have with your health care provider. Document Revised: 03/07/2020 Document Reviewed: 03/07/2020 Elsevier Patient Education  [place] After This sheet gives you information about how to care for yourself after your procedure. Your health care provider may also give you more specific instructions. If you have problems or questions, contact your health care provider. What can I expect after the procedure? After the procedure, it is common to have: Tiredness. Forgetfulness about what happened after the procedure. Impaired judgment for important decisions. Nausea or vomiting. Some difficulty with balance. Follow these instructions at home: For the time period you were told by your health care provider:   Rest as needed. Do not participate in activities where you could fall or become injured. Do not drive or use machinery. Do not drink alcohol. Do not take sleeping pills or medicines that cause drowsiness. Do not make important decisions or sign legal documents. Do not take care of children on your own. Eating and  drinking Follow the diet that is recommended by your health care provider. Drink enough fluid to keep your urine pale yellow. If you vomit: Drink water, juice, or soup when you can drink without vomiting. Make sure you have little or no nausea before eating solid foods. General instructions Have a responsible adult stay with you for the time you are told. It is important to have someone help care for you until you are awake and alert. Take over-the-counter and prescription medicines only as told by your health care provider. If you have sleep apnea, surgery and certain medicines can increase your risk for breathing problems. Follow instructions from your health care provider about wearing your sleep device: Anytime you are sleeping, including during daytime naps. While taking prescription pain medicines, sleeping medicines, or medicines that make you drowsy. Avoid smoking. Keep all follow-up visits as told by your health  care provider. This is important. Contact a health care provider if: You keep feeling nauseous or you keep vomiting. You feel light-headed. You are still sleepy or having trouble with balance after 24 hours. You develop a rash. You have a fever. You have redness or swelling around the IV site. Get help right away if: You have trouble breathing. You have new-onset confusion at home. Summary For several hours after your procedure, you may feel tired. You may also be forgetful and have poor judgment. Have a responsible adult stay with you for the time you are told. It is important to have someone help care for you until you are awake and alert. Rest as told. Do not drive or operate machinery. Do not drink alcohol or take sleeping pills. Get help right away if you have trouble breathing, or if you suddenly become confused. This information is not intended to replace advice given to you by your health care provider. Make sure you discuss any questions you have with your  health care provider. Document Revised: 07/05/2020 Document Reviewed: 09/22/2019 Elsevier Patient Education  2022 [employer].

## 2021-09-10 ENCOUNTER — Other Ambulatory Visit: Payer: Self-pay

## 2021-09-10 ENCOUNTER — Encounter: Payer: Medicare Other | Admitting: Family Medicine

## 2021-09-10 ENCOUNTER — Encounter (HOSPITAL_COMMUNITY)
Admission: RE | Admit: 2021-09-10 | Discharge: 2021-09-10 | Disposition: A | Discharge status: Home / Self Care | Payer: Medicare Other | Source: Ambulatory Visit | Attending: Internal Medicine | Admitting: Internal Medicine

## 2021-09-10 ENCOUNTER — Other Ambulatory Visit (HOSPITAL_COMMUNITY): Payer: Medicare Other

## 2021-09-10 DIAGNOSIS — Z01810 Encounter for preprocedural cardiovascular examination: Secondary | ICD-10-CM | POA: Diagnosis present

## 2021-09-12 ENCOUNTER — Ambulatory Visit (HOSPITAL_COMMUNITY)
Admission: RE | Admit: 2021-09-12 | Discharge: 2021-09-12 | Disposition: A | Discharge status: Home / Self Care | Payer: Medicare Other | Attending: Internal Medicine | Admitting: Internal Medicine

## 2021-09-12 ENCOUNTER — Encounter (HOSPITAL_COMMUNITY)
Admission: RE | Disposition: A | Discharge status: Home / Self Care | Payer: Self-pay | Source: Home / Self Care | Attending: Internal Medicine

## 2021-09-12 ENCOUNTER — Ambulatory Visit (HOSPITAL_COMMUNITY): Payer: Medicare Other | Admitting: Anesthesiology

## 2021-09-12 DIAGNOSIS — K449 Diaphragmatic hernia without obstruction or gangrene: Secondary | ICD-10-CM | POA: Insufficient documentation

## 2021-09-12 DIAGNOSIS — F17210 Nicotine dependence, cigarettes, uncomplicated: Secondary | ICD-10-CM | POA: Diagnosis not present

## 2021-09-12 DIAGNOSIS — K222 Esophageal obstruction: Secondary | ICD-10-CM | POA: Insufficient documentation

## 2021-09-12 DIAGNOSIS — R1310 Dysphagia, unspecified: Secondary | ICD-10-CM | POA: Diagnosis present

## 2021-09-12 HISTORY — PX: ESOPHAGOGASTRODUODENOSCOPY (EGD) WITH PROPOFOL: SHX5813

## 2021-09-12 HISTORY — PX: MALONEY DILATION: SHX5535

## 2021-09-12 SURGERY — ESOPHAGOGASTRODUODENOSCOPY (EGD) WITH PROPOFOL
Anesthesia: General

## 2021-09-12 MED ORDER — PROPOFOL 10 MG/ML IV BOLUS
INTRAVENOUS | Status: DC | PRN
  Administered 2021-09-12: 100 mg via INTRAVENOUS
  Administered 2021-09-12: 50 mg via INTRAVENOUS

## 2021-09-12 MED ORDER — STERILE WATER FOR IRRIGATION IR SOLN
Status: DC | PRN
  Administered 2021-09-12: 30 mL

## 2021-09-12 MED ORDER — LACTATED RINGERS IV SOLN: INTRAVENOUS | Status: DC | PRN

## 2021-09-12 MED ORDER — LACTATED RINGERS IV SOLN: INTRAVENOUS | Status: DC

## 2021-09-12 MED ORDER — PROPOFOL 500 MG/50ML IV EMUL
INTRAVENOUS | Status: DC | PRN
  Administered 2021-09-12: 150 ug/kg/min via INTRAVENOUS

## 2021-09-12 MED ORDER — LIDOCAINE HCL (CARDIAC) PF 100 MG/5ML IV SOSY
PREFILLED_SYRINGE | INTRAVENOUS | Status: DC | PRN
  Administered 2021-09-12: 100 mg via INTRATRACHEAL

## 2021-09-12 NOTE — Transfer of Care (Signed)
Immediate Anesthesia Transfer of Care Note  Patient: Richard Velez  Procedure(s) Performed: ESOPHAGOGASTRODUODENOSCOPY (EGD) WITH PROPOFOL [place]  Patient Location: PACU  Anesthesia Type:General  Level of Consciousness: awake, alert , oriented and sedated  Airway & Oxygen Therapy: Patient Spontanous Breathing  Post-op Assessment: Report given to RN and Post -op Vital signs reviewed and stable  Post vital signs: Reviewed and stable  Last Vitals:  Vitals Value Taken Time  BP 120/81 09/12/21 1645  Temp 36.5 C 09/12/21 1645  Pulse 82 09/12/21 1645  Resp 20 09/12/21 1645  SpO2 99 % 09/12/21 1645    Last Pain:  Vitals:   09/12/21 1645  TempSrc:   PainSc: 0-No pain      Patients Stated Pain Goal: 7 (66/06/30 1601)  Complications: No notable events documented.

## 2021-09-12 NOTE — Discharge Instructions (Signed)
EGD Discharge instructions Please read the instructions outlined below and refer to this sheet in the next few weeks. These discharge instructions provide you with general information on caring for yourself after you leave the hospital. Your doctor may also give you specific instructions. While your treatment has been planned according to the most current medical practices available, unavoidable complications occasionally occur. If you have any problems or questions after discharge, please call your doctor. ACTIVITY You may resume your regular activity but move at a slower pace for the next 24 hours.  Take frequent rest periods for the next 24 hours.  Walking will help expel (get rid of) the air and reduce the bloated feeling in your abdomen.  No driving for 24 hours (because of the anesthesia (medicine) used during the test).  You may shower.  Do not sign any important legal documents or operate any machinery for 24 hours (because of the anesthesia used during the test).  NUTRITION Drink plenty of fluids.  You may resume your normal diet.  Begin with a light meal and progress to your normal diet.  Avoid alcoholic beverages for 24 hours or as instructed by your caregiver.  MEDICATIONS You may resume your normal medications unless your caregiver tells you otherwise.  WHAT YOU CAN EXPECT TODAY You may experience abdominal discomfort such as a feeling of fullness or "gas" pains.  FOLLOW-UP Your doctor will discuss the results of your test with you.  SEEK IMMEDIATE MEDICAL ATTENTION IF ANY OF THE FOLLOWING OCCUR: Excessive nausea (feeling sick to your stomach) and/or vomiting.  Severe abdominal pain and distention (swelling).  Trouble swallowing.  Temperature over 101 F (37.8 C).  Rectal bleeding or vomiting of blood.     You got a larger stretch today  Continue omeprazole 40 mg twice daily  Office visit with [place] in 4 months  At patient request, I called [facility]  [phone] not set up"

## 2021-09-12 NOTE — Anesthesia Preprocedure Evaluation (Signed)
Anesthesia Evaluation  Patient identified by MRN, date of birth, ID band Patient awake    Reviewed: Allergy & Precautions, NPO status , Patient's Chart, lab work & pertinent test results  History of Anesthesia Complications Negative for: history of anesthetic complications  Airway Mallampati: II  TM Distance: <3 FB Neck ROM: Full    Dental  (+) Dental Advisory Given, Poor Dentition, Chipped   Pulmonary COPD,  COPD inhaler, Current SmokerPatient did not abstain from smoking.,    Pulmonary exam normal breath sounds clear to auscultation       Cardiovascular Exercise Tolerance: Good hypertension, Pt. on medications Normal cardiovascular exam Rhythm:Regular Rate:Normal  10-Sep-2021 13:34:38 [place] [facility]-AP-300 ROUTINE RECORD 1956/07/16 (61 yr) Male Caucasian Vent. rate 79 BPM PR interval 152 ms QRS duration 94 ms QT/QTcB 368/421 ms P-R-T axes 66 -7 -1 Normal sinus rhythm Cannot rule out Anterior infarct , age undetermined Abnormal ECG   Neuro/Psych negative neurological ROS  negative psych ROS   GI/Hepatic Neg liver ROS, GERD  Medicated and Controlled,  Endo/Other  negative endocrine ROS  Renal/GU negative Renal ROS  negative genitourinary   Musculoskeletal negative musculoskeletal ROS (+)   Abdominal   Peds negative pediatric ROS (+)  Hematology negative hematology ROS (+)   Anesthesia Other Findings   Reproductive/Obstetrics negative OB ROS                             Anesthesia Physical Anesthesia Plan  ASA: 2  Anesthesia Plan: General   Post-op Pain Management:    Induction: Intravenous  PONV Risk Score and Plan: TIVA  Airway Management Planned: Nasal Cannula and Natural Airway  Additional Equipment:   Intra-op Plan:   Post-operative Plan:   Informed Consent: I have reviewed the patients History and Physical, chart, labs and discussed the procedure  including the risks, benefits and alternatives for the proposed anesthesia with the patient or authorized representative who has indicated his/her understanding and acceptance.     Dental advisory given  Plan Discussed with: CRNA and Surgeon  Anesthesia Plan Comments:         Anesthesia Quick Evaluation

## 2021-09-12 NOTE — Anesthesia Postprocedure Evaluation (Signed)
Anesthesia Post Note  Patient: Richard Velez  Procedure(s) Performed: ESOPHAGOGASTRODUODENOSCOPY (EGD) WITH PROPOFOL [place]  Patient location during evaluation: Phase II Anesthesia Type: General Level of consciousness: awake and alert and oriented Pain management: pain level controlled Vital Signs Assessment: post-procedure vital signs reviewed and stable Respiratory status: spontaneous breathing, nonlabored ventilation and respiratory function stable Cardiovascular status: blood pressure returned to baseline and stable Postop Assessment: no apparent nausea or vomiting Anesthetic complications: no   No notable events documented.   Last Vitals:  Vitals:   09/12/21 1345 09/12/21 1645  BP: 135/77 120/81  Pulse: 77 82  Resp: 13 20  Temp:  36.5 C  SpO2: 98% 99%    Last Pain:  Vitals:   09/12/21 1645  TempSrc:   PainSc: 0-No pain                 Alyannah Sanks C Daliyah Sramek

## 2021-09-12 NOTE — H&P (Signed)
@LOGO @   Primary Care Physician:  Loman Brooklyn, FNP Primary Gastroenterologist:  Dr.   Pre-Procedure History & Physical: HPI:  Richard Velez is a 65 y.o. male here for further evaluation/intervention of peptic stricture with secondary dysphagia.  EGD earlier this year demonstrated a peptic stricture dilated to 73 [place] with [facility] dilator.  Omeprazole 40 mg twice daily since that time.  Omeprazole controls reflux symptoms.  Dysphagia not that much better today as he reports.  Past Medical History:  Diagnosis Date   Aortic atherosclerosis ([place]) 02/19/2021   Cataract    Emphysema lung ([place]) 02/19/2021   Enlarged prostate 01/10/2021   Essential hypertension 01/10/2021   Gastroesophageal reflux disease 01/10/2021   History of kidney stones    Lung nodules    Benign in appearance on low-dose lung cancer screening CT on 02/19/2021   Mixed hyperlipidemia 01/11/2021    Past Surgical History:  Procedure Laterality Date   BIOPSY  02/27/2021   Procedure: BIOPSY;  Surgeon: [name], MD;  Location: [facility];  Service: Endoscopy;;  gastric esophageal   COLONOSCOPY N/A 02/27/2021   Two 5 to 6 mm polyps at the ileocecal valve (tubular adenomas), examination was otherwise normal.   ESOPHAGOGASTRODUODENOSCOPY N/A 02/27/2021   Distal esophageal stenosis/stricture.-**Dilated**   EYE SURGERY Bilateral    cataract   MALONEY DILATION N/A 02/27/2021   Procedure: Venia Minks DILATION;  Surgeon: [name], MD;  Location: [facility];  Service: Endoscopy;  Laterality: N/A;   POLYPECTOMY  02/27/2021   Procedure: POLYPECTOMY;  Surgeon: [name], MD;  Location: [facility];  Service: Endoscopy;;  colon    Prior to Admission medications   Medication Sig Start Date End Date Taking? Authorizing Provider  albuterol (VENTOLIN HFA) 108 (90 Base) MCG/ACT inhaler Inhale 2 puffs into the lungs every 6 (six) hours as needed. Patient taking differently: Inhale 2 puffs into the lungs  every 6 (six) hours as needed for wheezing or shortness of breath. 02/21/21  Yes Loman Brooklyn, FNP  aspirin 81 MG EC tablet Take 1 tablet (81 mg total) by mouth daily. Swallow whole. 03/26/21  Yes [name] F, FNP  atorvastatin (LIPITOR) 10 MG tablet Take 1 tablet (10 mg total) by mouth daily. 05/07/21  Yes Loman Brooklyn, FNP  losartan (COZAAR) 100 MG tablet Take 1 tablet (100 mg total) by mouth daily. 07/01/21  Yes [name] F, FNP  meclizine (ANTIVERT) 25 MG tablet Take 1 tablet (25 mg total) by mouth 3 (three) times daily as needed for dizziness. 03/26/21  Yes [name] F, FNP  omeprazole (PRILOSEC) 40 MG capsule TAKE ONE CAPSULE TWICE DAILY 08/29/21  Yes Mahala Menghini, PA-C  Carboxymethylcellulose Sodium (REFRESH PLUS OP) Place 1 drop into both eyes every 6 (six) hours as needed (dry eyes).    [provider]    Allergies as of 08/27/2021 - Review Complete 08/20/2021  Allergen Reaction Noted   Lisinopril Cough 02/21/2021   Oxycodone-acetaminophen Itching and Nausea Only 12/17/2017    Family History  Problem Relation Age of Onset   Hypertension Mother    Diabetes Mother    Heart attack Father    Diabetes Sister    Diabetes Brother    Colon cancer Neg Hx    Gastric cancer Neg Hx    Esophageal cancer Neg Hx     Social History   Socioeconomic History   Marital status: Married    Spouse name: Not on file   Number  of children: Not on file   Years of education: Not on file   Highest education level: Not on file  Occupational History   Occupation: Retired    Comment: Delivered ice  Tobacco Use   Smoking status: Every Day    Packs/day: 1.00    Types: Cigarettes    Start date: 01/11/1971   Smokeless tobacco: Never  Vaping Use   Vaping Use: Never used  Substance and Sexual Activity   Alcohol use: Yes    Comment: occ   Drug use: Never   Sexual activity: Not on file  Other Topics Concern   Not on file  Social History Narrative   Not on file    Social Determinants of Health   Financial Resource Strain: Not on file  Food Insecurity: Not on file  Transportation Needs: Not on file  Physical Activity: Not on file  Stress: Not on file  Social Connections: Not on file  Intimate Partner Violence: Not on file    Review of Systems: See HPI, otherwise negative ROS  Physical Exam: BP 135/77   Pulse 77   Temp 98.6 F (37 C) (Oral)   Resp 13   SpO2 98%  General:   Alert,  Well-developed, well-nourished, pleasant and cooperative in NAD Neck:  Supple; no masses or thyromegaly. No significant cervical adenopathy. Lungs:  Clear throughout to auscultation.   No wheezes, crackles, or rhonchi. No acute distress. Heart:  Regular rate and rhythm; no murmurs, clicks, rubs,  or gallops. Abdomen: Non-distended, normal bowel sounds.  Soft and nontender without appreciable mass or hepatosplenomegaly.  Pulses:  Normal pulses noted. Extremities:  Without clubbing or edema.  Impression/Plan: 65 year old gentleman with esophageal dysphagia due to a known peptic stricture.  Likely needs additional dilation as he continues to have significant dysphagia.  I have offered the patient an EGD with esophageal dilation as feasible/appropriate today. The risks, benefits, limitations, alternatives and imponderables have been reviewed with the patient. Potential for esophageal dilation, biopsy, etc. have also been reviewed.  Questions have been answered. All parties agreeable.      Notice: This dictation was prepared with Dragon dictation along with smaller phrase technology. Any transcriptional errors that result from this process are unintentional and may not be corrected upon review.

## 2021-09-12 NOTE — Op Note (Signed)
[facility] Patient Name: Richard Velez Procedure Date: 09/12/2021 4:03 PM MRN: [id number] Date of Birth: Sep 11, 1956 Attending MD: Norvel Richards , MD CSN: [id number] Age: 65 Admit Type: Outpatient Procedure:                Upper GI endoscopy Indications:              Dysphagia Providers:                Norvel Richards, MD, Gwynneth Albright RN,                            RN, Randa Spike, Technician Referring MD:              Medicines:                Propofol per Anesthesia Complications:            No immediate complications. Estimated Blood Loss:     Estimated blood loss was minimal. Procedure:                Pre-Anesthesia Assessment:                           - Prior to the procedure, a History and Physical                            was performed, and patient medications and                            allergies were reviewed. The patient's tolerance of                            previous anesthesia was also reviewed. The risks                            and benefits of the procedure and the sedation                            options and risks were discussed with the patient.                            All questions were answered, and informed consent                            was obtained. Prior Anticoagulants: The patient has                            taken no previous anticoagulant or antiplatelet                            agents. ASA Grade Assessment: III - A patient with                            severe systemic disease. After reviewing the risks  and benefits, the patient was deemed in                            satisfactory condition to undergo the procedure.                           After obtaining informed consent, the endoscope was                            passed under direct vision. Throughout the                            procedure, the patient's blood pressure, pulse, and                            oxygen  saturations were monitored continuously. The                            GIF-H190 ([id number]) scope was introduced through the                            mouth, and advanced to the second part of duodenum.                            The upper GI endoscopy was accomplished without                            difficulty. The patient tolerated the procedure                            well. Scope In: 4:19:44 PM Scope Out: 4:28:29 PM Total Procedure Duration: 0 hours 8 minutes 45 seconds  Findings:      A moderate Schatzki ring was found at the gastroesophageal junction.       Esophageal mucosa otherwise appeared normal. Stomach empty. owed       moderate mucosal disruption. Estimated blood loss was minimal.      A medium-sized hiatal hernia was present. Gastric mucosa otherwise       appeared normal. Patent pylorus.      The duodenal bulb and second portion of the duodenum were normal. The       scope was withdrawn. Dilation was performed with a Maloney dilator with       mild resistance at 56 Fr. Dilation was performed with a Maloney dilator       with mild resistance at 12 Fr. The dilation site was examined following       endoscope reinsertion and showed moderate mucosal disruption. Estimated       blood loss was minimal. Impression:               - Moderate Schatzki ring. Dilated.                           - Medium-sized hiatal hernia.                           - Normal duodenal bulb and  second portion of the                            duodenum.                           - No specimens collected. Moderate Sedation:      Moderate (conscious) sedation was personally administered by an       anesthesia professional. The following parameters were monitored: oxygen       saturation, heart rate, blood pressure, respiratory rate, EKG, adequacy       of pulmonary ventilation, and response to care. Recommendation:           - Patient has a contact number available for                             emergencies. The signs and symptoms of potential                            delayed complications were discussed with the                            patient. Return to normal activities tomorrow.                            Written discharge instructions were provided to the                            patient.                           - Resume previous diet.                           - Continue present medications. Continue omeprazole                            40 mg twice daily.                           - Return to my office in 4 months. Procedure Code(s):        --- Professional ---                           [phone], Esophagogastroduodenoscopy, flexible,                            transoral; diagnostic, including collection of                            specimen(s) by brushing or washing, when performed                            (separate procedure)                           02725, Dilation of esophagus, by unguided sound or  bougie, single or multiple passes Diagnosis Code(s):        --- Professional ---                           K22.2, Esophageal obstruction                           K44.9, Diaphragmatic hernia without obstruction or                            gangrene                           R13.10, Dysphagia, unspecified CPT copyright 2019 American Medical Association. All rights reserved. The codes documented in this report are preliminary and upon coder review may  be revised to meet current compliance requirements. Cristopher Estimable. Jhamal Plucinski, MD Norvel Richards, MD 09/12/2021 4:39:04 PM This report has been signed electronically. Number of Addenda: 0

## 2021-09-18 ENCOUNTER — Encounter (HOSPITAL_COMMUNITY): Payer: Self-pay | Admitting: Internal Medicine

## 2021-09-30 ENCOUNTER — Other Ambulatory Visit: Payer: Self-pay | Admitting: Family Medicine

## 2021-09-30 DIAGNOSIS — I10 Essential (primary) hypertension: Secondary | ICD-10-CM

## 2021-10-09 ENCOUNTER — Ambulatory Visit (INDEPENDENT_AMBULATORY_CARE_PROVIDER_SITE_OTHER): Payer: Medicare Other

## 2021-10-09 VITALS — Ht 68.0 in | Wt 180.0 lb

## 2021-10-09 DIAGNOSIS — Z5941 Food insecurity: Secondary | ICD-10-CM | POA: Diagnosis not present

## 2021-10-09 DIAGNOSIS — Z599 Problem related to housing and economic circumstances, unspecified: Secondary | ICD-10-CM | POA: Diagnosis not present

## 2021-10-09 DIAGNOSIS — Z0001 Encounter for general adult medical examination with abnormal findings: Secondary | ICD-10-CM

## 2021-10-09 DIAGNOSIS — Z0000 Encounter for general adult medical examination without abnormal findings: Secondary | ICD-10-CM

## 2021-10-09 NOTE — Progress Notes (Signed)
Subjective:   Richard Velez is a 65 y.o. male who presents for an Initial Medicare Annual Wellness Visit.  Virtual Visit via Telephone Note  I connected with  Richard Velez on 10/09/21 at  2:45 PM EST by telephone and verified that I am speaking with the correct person using two identifiers.  Location: Patient: Home Provider: [facility] Persons participating in the virtual visit: patient/Nurse Health Advisor   I discussed the limitations, risks, security and privacy concerns of performing an evaluation and management service by telephone and the availability of in person appointments. The patient expressed understanding and agreed to proceed.  Interactive audio and video telecommunications were attempted between this nurse and patient, however failed, due to patient having technical difficulties OR patient did not have access to video capability.  We continued and completed visit with audio only.  Some vital signs may be absent or patient reported.   Richard Velez E Richard Evans, LPN    Review of Systems      Cardiac Risk Factors include: advanced age (>74men, >56 women);male gender;hypertension;dyslipidemia;family history of premature cardiovascular disease;smoking/ tobacco exposure;sedentary lifestyle;Other (see comment), Risk factor comments: Emphysema, atherosclerosis     Objective:    Today's Vitals   10/09/21 1432  Weight: 180 lb (81.6 kg)  Height: 5\' 8"  (1.727 m)   Body mass index is 27.37 kg/m.  Advanced Directives 10/09/2021 09/12/2021 09/10/2021 05/15/2021 02/27/2021  Does Patient Have a Medical Advance Directive? No No No No No  Would patient like information on creating a medical advance directive? No - Patient declined No - Patient declined No - Patient declined No - Patient declined No - Patient declined    Current Medications (verified) Outpatient Encounter Medications as of 10/09/2021  Medication Sig   albuterol (VENTOLIN HFA) 108 (90 Base) MCG/ACT inhaler Inhale 2 puffs into  the lungs every 6 (six) hours as needed. (Patient taking differently: Inhale 2 puffs into the lungs every 6 (six) hours as needed for wheezing or shortness of breath.)   aspirin 81 MG EC tablet Take 1 tablet (81 mg total) by mouth daily. Swallow whole.   atorvastatin (LIPITOR) 10 MG tablet Take 1 tablet (10 mg total) by mouth daily.   Carboxymethylcellulose Sodium (REFRESH PLUS OP) Place 1 drop into both eyes every 6 (six) hours as needed (dry eyes).   losartan (COZAAR) 100 MG tablet TAKE ONE TABLET ONCE DAILY   meclizine (ANTIVERT) 25 MG tablet Take 1 tablet (25 mg total) by mouth 3 (three) times daily as needed for dizziness.   omeprazole (PRILOSEC) 40 MG capsule TAKE ONE CAPSULE TWICE DAILY   No facility-administered encounter medications on file as of 10/09/2021.    Allergies (verified) Lisinopril and Oxycodone-acetaminophen   History: Past Medical History:  Diagnosis Date   Aortic atherosclerosis ([place]) 02/19/2021   Cataract    Emphysema lung ([place]) 02/19/2021   Enlarged prostate 01/10/2021   Essential hypertension 01/10/2021   Gastroesophageal reflux disease 01/10/2021   History of kidney stones    Lung nodules    Benign in appearance on low-dose lung cancer screening CT on 02/19/2021   Mixed hyperlipidemia 01/11/2021   Past Surgical History:  Procedure Laterality Date   BIOPSY  02/27/2021   Procedure: BIOPSY;  Surgeon: [name], MD;  [facility];  Service: Endoscopy;;  gastric esophageal   COLONOSCOPY N/A 02/27/2021   Two 5 to 6 mm polyps at the ileocecal valve (tubular adenomas), examination was otherwise normal.   ESOPHAGOGASTRODUODENOSCOPY N/A 02/27/2021   Distal esophageal  stenosis/stricture.-**Dilated**   ESOPHAGOGASTRODUODENOSCOPY (EGD) WITH PROPOFOL N/A 09/12/2021   Procedure: ESOPHAGOGASTRODUODENOSCOPY (EGD) WITH PROPOFOL;  Surgeon: [name], MD;  [facility];  Service: Endoscopy;  Laterality: N/A;  2:45pm   EYE SURGERY Bilateral     cataract   MALONEY DILATION N/A 02/27/2021   Procedure: MALONEY DILATION;  Surgeon: [name], MD;  [facility];  Service: Endoscopy;  Laterality: N/A;   MALONEY DILATION N/A 09/12/2021   Procedure: Venia Minks DILATION;  Surgeon: [name], MD;  [facility];  Service: Endoscopy;  Laterality: N/A;   POLYPECTOMY  02/27/2021   Procedure: POLYPECTOMY;  Surgeon: [name], MD;  [facility];  Service: Endoscopy;;  colon   Family History  Problem Relation Age of Onset   Hypertension Mother    Diabetes Mother    Heart attack Father    Diabetes Sister    Diabetes Brother    Colon cancer Neg Hx    Gastric cancer Neg Hx    Esophageal cancer Neg Hx    Social History   Socioeconomic History   Marital status: Married    Spouse name: Butch Penny   Number of children: Not on file   Years of education: Not on file   Highest education level: Not on file  Occupational History   Occupation: Retired    Comment: Delivered ice  Tobacco Use   Smoking status: Every Day    Packs/day: 1.00    Types: Cigarettes    Start date: 01/11/1971   Smokeless tobacco: Never  Vaping Use   Vaping Use: Never used  Substance and Sexual Activity   Alcohol use: Yes    Comment: occ   Drug use: Never   Sexual activity: Not on file  Other Topics Concern   Not on file  Social History Narrative   Not on file   Social Determinants of Health   Financial Resource Strain: Medium Risk   Difficulty of Paying Living Expenses: Somewhat hard  Food Insecurity: Food Insecurity Present   Worried About Charity fundraiser in the Last Year: Sometimes true   Ran Out of Food in the Last Year: Never true  Transportation Needs: No Transportation Needs   Lack of Transportation (Medical): No   Lack of Transportation (Non-Medical): No  Physical Activity: Insufficiently Active   Days of Exercise per Week: 4 days   Minutes of Exercise per Session: 20 min  Stress: No Stress Concern  Present   Feeling of Stress : Only a little  Social Connections: Engineer, building services of Communication with Friends and Family: More than three times a week   Frequency of Social Gatherings with Friends and Family: More than three times a week   Attends Religious Services: More than 4 times per year   Active Member of [employer] or Organizations: Yes   Attends Music therapist: More than 4 times per year   Marital Status: Married    Tobacco Counseling Ready to quit: Not Answered Counseling given: Not Answered   Clinical Intake:  Pre-visit preparation completed: Yes  Pain : No/denies pain     BMI - recorded: 27.37 Nutritional Status: BMI 25 -29 Overweight Nutritional Risks: None Diabetes: No  How often do you need to have someone help you when you read instructions, pamphlets, or other written materials from your doctor or pharmacy?: 1 - Never  Diabetic? no  Interpreter Needed?: No  Information entered by :: Adalberto Cole, LPN  Activities of Daily Living In your present state of health, do you have any difficulty performing the following activities: 10/09/2021 09/10/2021  Hearing? N N  Vision? N N  Difficulty concentrating or making decisions? N N  Walking or climbing stairs? N N  Dressing or bathing? N N  Doing errands, shopping? N N  Preparing Food and eating ? N -  Using the Toilet? N -  In the past six months, have you accidently leaked urine? N -  Do you have problems with loss of bowel control? N -  Managing your Medications? N -  Managing your Finances? N -  Housekeeping or managing your Housekeeping? N -  Some recent data might be hidden    Patient Care Team: Loman Brooklyn, FNP as PCP - General (Family Medicine) Gala Romney Cristopher Estimable, MD as Consulting Physician (Gastroenterology)  Indicate any recent Medical Services you may have received from other than [facility] providers in the past year (date may be approximate).     Assessment:    This is a routine wellness examination for K[place].  Hearing/Vision screen Hearing Screening - Comments:: Denies hearing difficulties  Vision Screening - Comments:: Denies vision difficulties - up to date with annual eye exams with [facility] in [place]  Dietary issues and exercise activities discussed: Current Exercise Habits: The patient does not participate in regular exercise at present, Exercise limited by: respiratory conditions(s)   Goals Addressed             This Visit's Progress    Exercise 150 min/wk Moderate Activity         Depression Screen PHQ 2/9 Scores 10/09/2021 05/07/2021 03/26/2021 02/21/2021 01/08/2021  PHQ - 2 Score 0 0 0 0 0  PHQ- 9 Score - 3 5 - -    Fall Risk Fall Risk  10/09/2021 05/07/2021 03/26/2021 02/21/2021 01/08/2021  Falls in the past year? 0 0 0 0 0  Number falls in past yr: 0 - - - -  Injury with Fall? 0 - - - -  Risk for fall due to : History of fall(s);Orthopedic patient;Impaired balance/gait - - - -  Follow up Falls prevention discussed;Education provided - - - -    FALL RISK PREVENTION PERTAINING TO THE HOME:  Any stairs in or around the home? No  If so, are there any without handrails? No  Home free of loose throw rugs in walkways, pet beds, electrical cords, etc? Yes  Adequate lighting in your home to reduce risk of falls? Yes   ASSISTIVE DEVICES UTILIZED TO PREVENT FALLS:  Life alert? No  Use of a cane, walker or w/c? No  Grab bars in the bathroom? No  Shower chair or bench in shower? No  Elevated toilet seat or a handicapped toilet? No   TIMED UP AND GO:  Was the test performed? No . Telephonic visit  Cognitive Function:     6CIT Screen 10/09/2021  What Year? 0 points  What month? 0 points  What time? 0 points  Count back from 20 0 points  Months in reverse 0 points  Repeat phrase 6 points  Total Score 6    Immunizations Immunization History  Administered Date(s) Administered   Tdap 02/21/2021    TDAP status: Up  to date  Flu Vaccine status: Declined, Education has been provided regarding the importance of this vaccine but patient still declined. Advised may receive this vaccine at local pharmacy or Health Dept. Aware to provide a copy of the vaccination  record if obtained from local pharmacy or Health Dept. Verbalized acceptance and understanding.  Pneumococcal vaccine status: Declined,  Education has been provided regarding the importance of this vaccine but patient still declined. Advised may receive this vaccine at local pharmacy or Health Dept. Aware to provide a copy of the vaccination record if obtained from local pharmacy or Health Dept. Verbalized acceptance and understanding.   Covid-19 vaccine status: Declined, Education has been provided regarding the importance of this vaccine but patient still declined. Advised may receive this vaccine at local pharmacy or Health Dept.or vaccine clinic. Aware to provide a copy of the vaccination record if obtained from local pharmacy or Health Dept. Verbalized acceptance and understanding.  Qualifies for Shingles Vaccine? Yes   Zostavax completed No   Shingrix Completed?: No.    Education has been provided regarding the importance of this vaccine. Patient has been advised to call insurance company to determine out of pocket expense if they have not yet received this vaccine. Advised may also receive vaccine at local pharmacy or Health Dept. Verbalized acceptance and understanding.  Screening Tests Health Maintenance  Topic Date Due   Pneumonia Vaccine 82+ Years old (1 - PCV) Never done   Zoster Vaccines- Shingrix (1 of 2) Never done   INFLUENZA VACCINE  Never done   Hepatitis C Screening  01/10/2022 (Originally 10/24/1974)   HIV Screening  01/10/2022 (Originally 10/24/1971)   COVID-19 Vaccine (1) 02/05/2022 (Originally 04/24/1957)   COLONOSCOPY (Pts 45-53yrs Insurance coverage will need to be confirmed)  02/27/2026   TETANUS/TDAP  02/22/2031   HPV VACCINES   Aged Out    Health Maintenance  Health Maintenance Due  Topic Date Due   Pneumonia Vaccine 22+ Years old (1 - PCV) Never done   Zoster Vaccines- Shingrix (1 of 2) Never done   INFLUENZA VACCINE  Never done    Colorectal cancer screening: Type of screening: Colonoscopy. Completed 02/27/2021. Repeat every 5 years  Lung Cancer Screening: (Low Dose CT Chest recommended if Age 49-80 years, 30 pack-year currently smoking OR have quit w/in 15years.) does qualify.   Lung Cancer Screening Referral: last done 02/2021  Additional Screening:  Hepatitis C Screening: does qualify; DUE  Vision Screening: Recommended annual ophthalmology exams for early detection of glaucoma and other disorders of the eye. Is the patient up to date with their annual eye exam?  Yes  Who is the provider or what is the name of the office in which the patient attends annual eye exams? [facility] If pt is not established with a provider, would they like to be referred to a provider to establish care? No .   Dental Screening: Recommended annual dental exams for proper oral hygiene  Community Resource Referral / Chronic Care Management: CRR required this visit?  Yes   CCM required this visit?  No      Plan:     I have personally reviewed and noted the following in the patient's chart:   Medical and social history Use of alcohol, tobacco or illicit drugs  Current medications and supplements including opioid prescriptions. Patient is not currently taking opioid prescriptions. Functional ability and status Nutritional status Physical activity Advanced directives List of other physicians Hospitalizations, surgeries, and ER visits in previous 12 months Vitals Screenings to include cognitive, depression, and falls Referrals and appointments  In addition, I have reviewed and discussed with patient certain preventive protocols, quality metrics, and best practice recommendations. A written personalized  care plan for preventive services as  well as general preventive health recommendations were provided to patient.     Sandrea Hammond, LPN   [id number]   Nurse Notes: CRR to see if financial assistance is available. Patient and wife struggling to pay for basic needs, food, utilities, rent, etc. Also, patient is having intermittent sharp shooting pains in random areas of body, all over at random times - not sure if it is due to hx of nerve block, but it is concerning and makes him jump at times it is so bad.

## 2021-10-09 NOTE — Patient Instructions (Signed)
[name] , Thank you for taking time to come for your Medicare Wellness Visit. I appreciate your ongoing commitment to your health goals. Please review the following plan we discussed and let me know if I can assist you in the future.   Screening recommendations/referrals: Colonoscopy: Done 02/27/2021 - Repeat in 5 years  Recommended yearly ophthalmology/optometry visit for glaucoma screening and checkup Recommended yearly dental visit for hygiene and checkup  Vaccinations: Influenza vaccine: Declined Pneumococcal vaccine: Declined Tdap vaccine: Done 02/21/2021 - Repeat in 10 years Shingles vaccine: Due   Covid-19: Declined  Advanced directives: Advance directive discussed with you today. Even though you declined this today, please call our office should you change your mind, and we can give you the proper paperwork for you to fill out.   Conditions/risks identified: Aim for 30 minutes of exercise or brisk walking each day, drink 6-8 glasses of water and eat lots of fruits and vegetables.   Next appointment: Follow up in one year for your annual wellness visit.   Preventive Care 66 Years and Older, Male  Preventive care refers to lifestyle choices and visits with your health care provider that can promote health and wellness. What does preventive care include? A yearly physical exam. This is also called an annual well check. Dental exams once or twice a year. Routine eye exams. Ask your health care provider how often you should have your eyes checked. Personal lifestyle choices, including: Daily care of your teeth and gums. Regular physical activity. Eating a healthy diet. Avoiding tobacco and drug use. Limiting alcohol use. Practicing safe sex. Taking low doses of aspirin every day. Taking vitamin and mineral supplements as recommended by your health care provider. What happens during an annual well check? The services and screenings done by your health care provider during  your annual well check will depend on your age, overall health, lifestyle risk factors, and family history of disease. Counseling  Your health care provider may ask you questions about your: Alcohol use. Tobacco use. Drug use. Emotional well-being. Home and relationship well-being. Sexual activity. Eating habits. History of falls. Memory and ability to understand (cognition). Work and work Statistician. Screening  You may have the following tests or measurements: Height, weight, and BMI. Blood pressure. Lipid and cholesterol levels. These may be checked every 5 years, or more frequently if you are over 53 years old. Skin check. Lung cancer screening. You may have this screening every year starting at age 58 if you have a 30-pack-year history of smoking and currently smoke or have quit within the past 15 years. Fecal occult blood test (FOBT) of the stool. You may have this test every year starting at age 66. Flexible sigmoidoscopy or colonoscopy. You may have a sigmoidoscopy every 5 years or a colonoscopy every 10 years starting at age 35. Prostate cancer screening. Recommendations will vary depending on your family history and other risks. Hepatitis C blood test. Hepatitis B blood test. Sexually transmitted disease (STD) testing. Diabetes screening. This is done by checking your blood sugar (glucose) after you have not eaten for a while (fasting). You may have this done every 1-3 years. Abdominal aortic aneurysm (AAA) screening. You may need this if you are a current or former smoker. Osteoporosis. You may be screened starting at age 60 if you are at high risk. Talk with your health care provider about your test results, treatment options, and if necessary, the need for more tests. Vaccines  Your health care provider may recommend certain  vaccines, such as: Influenza vaccine. This is recommended every year. Tetanus, diphtheria, and acellular pertussis (Tdap, Td) vaccine. You may need  a Td booster every 10 years. Zoster vaccine. You may need this after age 35. Pneumococcal 13-valent conjugate (PCV13) vaccine. One dose is recommended after age 51. Pneumococcal polysaccharide (PPSV23) vaccine. One dose is recommended after age 4. Talk to your health care provider about which screenings and vaccines you need and how often you need them. This information is not intended to replace advice given to you by your health care provider. Make sure you discuss any questions you have with your health care provider. Document Released: 11/16/2015 Document Revised: 07/09/2016 Document Reviewed: 08/21/2015 Elsevier Interactive Patient Education  2017 [place] Prevention in the Home Falls can cause injuries. They can happen to people of all ages. There are many things you can do to make your home safe and to help prevent falls. What can I do on the outside of my home? Regularly fix the edges of walkways and driveways and fix any cracks. Remove anything that might make you trip as you walk through a door, such as a raised step or threshold. Trim any bushes or trees on the path to your home. Use bright outdoor lighting. Clear any walking paths of anything that might make someone trip, such as rocks or tools. Regularly check to see if handrails are loose or broken. Make sure that both sides of any steps have handrails. Any raised decks and porches should have guardrails on the edges. Have any leaves, snow, or ice cleared regularly. Use sand or salt on walking paths during winter. Clean up any spills in your garage right away. This includes oil or grease spills. What can I do in the bathroom? Use night lights. Install grab bars by the toilet and in the tub and shower. Do not use towel bars as grab bars. Use non-skid mats or decals in the tub or shower. If you need to sit down in the shower, use a plastic, non-slip stool. Keep the floor dry. Clean up any water that spills on the  floor as soon as it happens. Remove soap buildup in the tub or shower regularly. Attach bath mats securely with double-sided non-slip rug tape. Do not have throw rugs and other things on the floor that can make you trip. What can I do in the bedroom? Use night lights. Make sure that you have a light by your bed that is easy to reach. Do not use any sheets or blankets that are too big for your bed. They should not hang down onto the floor. Have a firm chair that has side arms. You can use this for support while you get dressed. Do not have throw rugs and other things on the floor that can make you trip. What can I do in the kitchen? Clean up any spills right away. Avoid walking on wet floors. Keep items that you use a lot in easy-to-reach places. If you need to reach something above you, use a strong step stool that has a grab bar. Keep electrical cords out of the way. Do not use floor polish or wax that makes floors slippery. If you must use wax, use non-skid floor wax. Do not have throw rugs and other things on the floor that can make you trip. What can I do with my stairs? Do not leave any items on the stairs. Make sure that there are handrails on both sides of the stairs  and use them. Fix handrails that are broken or loose. Make sure that handrails are as long as the stairways. Check any carpeting to make sure that it is firmly attached to the stairs. Fix any carpet that is loose or worn. Avoid having throw rugs at the top or bottom of the stairs. If you do have throw rugs, attach them to the floor with carpet tape. Make sure that you have a light switch at the top of the stairs and the bottom of the stairs. If you do not have them, ask someone to add them for you. What else can I do to help prevent falls? Wear shoes that: Do not have high heels. Have rubber bottoms. Are comfortable and fit you well. Are closed at the toe. Do not wear sandals. If you use a stepladder: Make sure that  it is fully opened. Do not climb a closed stepladder. Make sure that both sides of the stepladder are locked into place. Ask someone to hold it for you, if possible. Clearly mark and make sure that you can see: Any grab bars or handrails. First and last steps. Where the edge of each step is. Use tools that help you move around (mobility aids) if they are needed. These include: Canes. Walkers. Scooters. Crutches. Turn on the lights when you go into a dark area. Replace any light bulbs as soon as they burn out. Set up your furniture so you have a clear path. Avoid moving your furniture around. If any of your floors are uneven, fix them. If there are any pets around you, be aware of where they are. Review your medicines with your doctor. Some medicines can make you feel dizzy. This can increase your chance of falling. Ask your doctor what other things that you can do to help prevent falls. This information is not intended to replace advice given to you by your health care provider. Make sure you discuss any questions you have with your health care provider. Document Released: 08/16/2009 Document Revised: 03/27/2016 Document Reviewed: 11/24/2014 Elsevier Interactive Patient Education  2017 [employer].

## 2021-10-15 ENCOUNTER — Encounter: Payer: Self-pay | Admitting: Family Medicine

## 2021-10-15 ENCOUNTER — Ambulatory Visit (INDEPENDENT_AMBULATORY_CARE_PROVIDER_SITE_OTHER): Payer: Medicare Other | Admitting: Family Medicine

## 2021-10-15 VITALS — BP 173/81 | HR 83 | Temp 99.3°F | Ht 68.0 in | Wt 181.0 lb

## 2021-10-15 DIAGNOSIS — E782 Mixed hyperlipidemia: Secondary | ICD-10-CM

## 2021-10-15 DIAGNOSIS — Z0001 Encounter for general adult medical examination with abnormal findings: Secondary | ICD-10-CM

## 2021-10-15 DIAGNOSIS — J439 Emphysema, unspecified: Secondary | ICD-10-CM | POA: Diagnosis not present

## 2021-10-15 DIAGNOSIS — R7309 Other abnormal glucose: Secondary | ICD-10-CM

## 2021-10-15 DIAGNOSIS — Z125 Encounter for screening for malignant neoplasm of prostate: Secondary | ICD-10-CM

## 2021-10-15 DIAGNOSIS — I10 Essential (primary) hypertension: Secondary | ICD-10-CM | POA: Diagnosis not present

## 2021-10-15 DIAGNOSIS — I700 Atherosclerosis of aorta: Secondary | ICD-10-CM

## 2021-10-15 DIAGNOSIS — Z0000 Encounter for general adult medical examination without abnormal findings: Secondary | ICD-10-CM

## 2021-10-15 MED ORDER — ATORVASTATIN CALCIUM 10 MG PO TABS: 10.0000 mg | ORAL_TABLET | Freq: Every day | ORAL | 1 refills | Status: DC

## 2021-10-15 MED ORDER — HYDROCHLOROTHIAZIDE 12.5 MG PO CAPS: 12.5000 mg | ORAL_CAPSULE | Freq: Every day | ORAL | 2 refills | Status: DC

## 2021-10-15 MED ORDER — SPIRIVA RESPIMAT 2.5 MCG/ACT IN AERS: 2.0000 | INHALATION_SPRAY | Freq: Every day | RESPIRATORY_TRACT | 2 refills | Status: DC

## 2021-10-15 MED ORDER — LOSARTAN POTASSIUM 100 MG PO TABS
100.0000 mg | ORAL_TABLET | Freq: Every day | ORAL | 1 refills | Status: DC
Start: 2021-10-15 — End: 2021-11-26

## 2021-10-15 NOTE — Progress Notes (Signed)
Assessment & Plan:  1. Well adult exam Preventive health education provided. - CBC with Differential/Platelet - CMP14+EGFR - Lipid panel  2. Pulmonary emphysema, unspecified emphysema type ([place]) Uncontrolled. Started Spiriva today. Continue Albuterol as needed. - Tiotropium Bromide Monohydrate (SPIRIVA RESPIMAT) 2.5 MCG/ACT AERS; Inhale 2 puffs into the lungs daily.  Dispense: 4 g; Refill: 2 - albuterol (VENTOLIN HFA) 108 (90 Base) MCG/ACT inhaler; Inhale 2 puffs into the lungs every 6 (six) hours as needed for wheezing or shortness of breath.  Dispense: 18 g; Refill: 5  3. Essential hypertension Uncontrolled. Started HCTZ. Continue Losartan. - losartan (COZAAR) 100 MG tablet; Take 1 tablet (100 mg total) by mouth daily.  Dispense: 90 tablet; Refill: 1 - CBC with Differential/Platelet - CMP14+EGFR - Lipid panel - TSH - hydrochlorothiazide (MICROZIDE) 12.5 MG capsule; Take 1 capsule (12.5 mg total) by mouth daily.  Dispense: 30 capsule; Refill: 2  4. Mixed hyperlipidemia Well controlled on current regimen.  - atorvastatin (LIPITOR) 10 MG tablet; Take 1 tablet (10 mg total) by mouth daily.  Dispense: 90 tablet; Refill: 1 - CMP14+EGFR - Lipid panel  5. Aortic atherosclerosis ([facility]) Continue statin and ASA. - atorvastatin (LIPITOR) 10 MG tablet; Take 1 tablet (10 mg total) by mouth daily.  Dispense: 90 tablet; Refill: 1 - CMP14+EGFR - Lipid panel  6. Screening PSA (prostate specific antigen) - PSA, total and free   Follow-up: Return in about 6 weeks (around 11/26/2021) for COPD & HTN.   [name], MSN, APRN, FNP-C [facility]  Subjective:  Patient ID: Richard Velez, male    DOB: 1956-09-27  Age: 65 y.o. MRN: [id number]  Patient Care Team: Loman Brooklyn, FNP as PCP - General (Family Medicine) Gala Romney, Cristopher Estimable, MD as Consulting Physician (Gastroenterology)   CC:  Chief Complaint  Patient presents with   Annual Exam    HPI Richard Velez  presents for his annual physical.  Occupation: Retired, Marital status: Married, Substance use: None Diet: regular, Exercise: none Last eye exam: one month ago Last dental exam: unknown Last colonoscopy: 02/27/2021 with recommended repeat in 5 years Lung Cancer Screening with low-dose Chest CT: 02/19/2021 AAA Screening: agreeable in completing Hepatitis C Screening: Declined PSA: Never Immunizations: Flu Vaccine: declined Tdap Vaccine: up to date  Shingrix Vaccine: declined  COVID-19 Vaccine: declined Pneumonia Vaccine: declined  Advanced Directives Patient does not have advanced directives including DNR, living will, healthcare power of attorney, financial power of attorney, and MOST form.   DEPRESSION SCREENING PHQ 2/9 Scores 10/15/2021 10/09/2021 05/07/2021 03/26/2021 02/21/2021 01/08/2021  PHQ - 2 Score 0 0 0 0 0 0  PHQ- 9 Score 0 - 3 5 - -     COPD: using Albuterol nightly. Known lung nodules benign on CT scan. Completing yearly low-dose lung cancer screening Cts.   Hypertension: patient does not monitor blood pressure at home.   Hyperlipidemia: tolerating atorvastatin daily.   Aortic atherosclerosis: taking atorvastatin and aspirin daily.   Review of Systems  Constitutional:  Negative for chills, fever, malaise/fatigue and weight loss.  HENT:  Negative for congestion, ear discharge, ear pain, nosebleeds, sinus pain, sore throat and tinnitus.   Eyes:  Negative for blurred vision, double vision, pain, discharge and redness.  Respiratory:  Negative for cough, shortness of breath and wheezing.   Cardiovascular:  Negative for chest pain, palpitations and leg swelling.  Gastrointestinal:  Negative for abdominal pain, constipation, diarrhea, heartburn, nausea and vomiting.  Genitourinary:  Negative for dysuria, frequency and urgency.  Musculoskeletal:  Positive for joint pain. Negative for myalgias.  Skin:  Negative for rash.  Neurological:  Negative for dizziness, seizures,  weakness and headaches.  Psychiatric/Behavioral:  Negative for depression, substance abuse and suicidal ideas. The patient is not nervous/anxious.     Current Outpatient Medications:    albuterol (VENTOLIN HFA) 108 (90 Base) MCG/ACT inhaler, Inhale 2 puffs into the lungs every 6 (six) hours as needed. (Patient taking differently: Inhale 2 puffs into the lungs every 6 (six) hours as needed for wheezing or shortness of breath.), Disp: 18 g, Rfl: 2   aspirin 81 MG EC tablet, Take 1 tablet (81 mg total) by mouth daily. Swallow whole., Disp: 30 tablet, Rfl: 12   atorvastatin (LIPITOR) 10 MG tablet, Take 1 tablet (10 mg total) by mouth daily., Disp: 90 tablet, Rfl: 1   Carboxymethylcellulose Sodium (REFRESH PLUS OP), Place 1 drop into both eyes every 6 (six) hours as needed (dry eyes)., Disp: , Rfl:    losartan (COZAAR) 100 MG tablet, TAKE ONE TABLET ONCE DAILY, Disp: 30 tablet, Rfl: 0   meclizine (ANTIVERT) 25 MG tablet, Take 1 tablet (25 mg total) by mouth 3 (three) times daily as needed for dizziness., Disp: 90 tablet, Rfl: 3   omeprazole (PRILOSEC) 40 MG capsule, TAKE ONE CAPSULE TWICE DAILY, Disp: 60 capsule, Rfl: 5  Allergies  Allergen Reactions   Lisinopril Cough   Oxycodone-Acetaminophen Itching and Nausea Only    Past Medical History:  Diagnosis Date   Aortic atherosclerosis ([place]) 02/19/2021   Cataract    Emphysema lung ([place]) 02/19/2021   Enlarged prostate 01/10/2021   Essential hypertension 01/10/2021   Gastroesophageal reflux disease 01/10/2021   History of kidney stones    Lung nodules    Benign in appearance on low-dose lung cancer screening CT on 02/19/2021   Mixed hyperlipidemia 01/11/2021    Past Surgical History:  Procedure Laterality Date   BIOPSY  02/27/2021   Procedure: BIOPSY;  Surgeon: [name], MD;  [facility];  Service: Endoscopy;;  gastric esophageal   COLONOSCOPY N/A 02/27/2021   Two 5 to 6 mm polyps at the ileocecal valve (tubular adenomas),  examination was otherwise normal.   ESOPHAGOGASTRODUODENOSCOPY N/A 02/27/2021   Distal esophageal stenosis/stricture.-**Dilated**   ESOPHAGOGASTRODUODENOSCOPY (EGD) WITH PROPOFOL N/A 09/12/2021   Procedure: ESOPHAGOGASTRODUODENOSCOPY (EGD) WITH PROPOFOL;  Surgeon: [name], MD;  [facility];  Service: Endoscopy;  Laterality: N/A;  2:45pm   EYE SURGERY Bilateral    cataract   MALONEY DILATION N/A 02/27/2021   Procedure: MALONEY DILATION;  Surgeon: [name], MD;  [facility];  Service: Endoscopy;  Laterality: N/A;   MALONEY DILATION N/A 09/12/2021   Procedure: Venia Minks DILATION;  Surgeon: [name], MD;  [facility];  Service: Endoscopy;  Laterality: N/A;   POLYPECTOMY  02/27/2021   Procedure: POLYPECTOMY;  Surgeon: [name], MD;  [facility];  Service: Endoscopy;;  colon    Family History  Problem Relation Age of Onset   Hypertension Mother    Diabetes Mother    Heart attack Father    Diabetes Sister    Diabetes Brother    Colon cancer Neg Hx    Gastric cancer Neg Hx    Esophageal cancer Neg Hx     Social History   Socioeconomic History   Marital status: Married    Spouse name: Butch Penny   Number of children: Not on file   Years of  education: Not on file   Highest education level: Not on file  Occupational History   Occupation: Retired    Comment: Delivered ice  Tobacco Use   Smoking status: Every Day    Packs/day: 1.00    Types: Cigarettes    Start date: 01/11/1971   Smokeless tobacco: Never  Vaping Use   Vaping Use: Never used  Substance and Sexual Activity   Alcohol use: Yes    Comment: occ   Drug use: Never   Sexual activity: Not on file  Other Topics Concern   Not on file  Social History Narrative   Not on file   Social Determinants of Health   Financial Resource Strain: Medium Risk   Difficulty of Paying Living Expenses: Somewhat hard  Food Insecurity: Food Insecurity Present    Worried About Running Out of Food in the Last Year: Sometimes true   Ran Out of Food in the Last Year: Never true  Transportation Needs: No Transportation Needs   Lack of Transportation (Medical): No   Lack of Transportation (Non-Medical): No  Physical Activity: Insufficiently Active   Days of Exercise per Week: 4 days   Minutes of Exercise per Session: 20 min  Stress: No Stress Concern Present   Feeling of Stress : Only a little  Social Connections: Engineer, building services of Communication with Friends and Family: More than three times a week   Frequency of Social Gatherings with Friends and Family: More than three times a week   Attends Religious Services: More than 4 times per year   Active Member of [employer] or Organizations: Yes   Attends Music therapist: More than 4 times per year   Marital Status: Married  Human resources officer Violence: Not At Risk   Fear of Current or Ex-Partner: No   Emotionally Abused: No   Physically Abused: No   Sexually Abused: No      Objective:    BP (!) 173/81    Pulse 83    Temp 99.3 F (37.4 C) (Temporal)    Ht '5\' 8"'  (1.727 m)    Wt 181 lb (82.1 kg)    SpO2 96%    BMI 27.52 kg/m   Wt Readings from Last 3 Encounters:  10/15/21 181 lb (82.1 kg)  10/09/21 180 lb (81.6 kg)  09/10/21 179 lb 3.2 oz (81.3 kg)    Physical Exam Vitals reviewed.  Constitutional:      General: He is not in acute distress.    Appearance: Normal appearance. He is overweight. He is not ill-appearing, toxic-appearing or diaphoretic.  HENT:     Head: Normocephalic and atraumatic.     Right Ear: Tympanic membrane, ear canal and external ear normal. There is no impacted cerumen.     Left Ear: Tympanic membrane, ear canal and external ear normal. There is no impacted cerumen.     Nose: Nose normal. No congestion or rhinorrhea.     Mouth/Throat:     Mouth: Mucous membranes are moist.     Pharynx: Oropharynx is clear. No oropharyngeal exudate or posterior  oropharyngeal erythema.  Eyes:     General: No scleral icterus.       Right eye: No discharge.        Left eye: No discharge.     Conjunctiva/sclera: Conjunctivae normal.     Pupils: Pupils are equal, round, and reactive to light.  Neck:     Vascular: No carotid bruit.  Cardiovascular:  Rate and Rhythm: Normal rate and regular rhythm.     Heart sounds: Normal heart sounds. No murmur heard.   No friction rub. No gallop.  Pulmonary:     Effort: Pulmonary effort is normal. No respiratory distress.     Breath sounds: Normal breath sounds. No stridor. No wheezing, rhonchi or rales.  Abdominal:     General: Abdomen is flat. Bowel sounds are normal. There is no distension.     Palpations: Abdomen is soft. There is no hepatomegaly, splenomegaly or mass.     Tenderness: There is no abdominal tenderness. There is no guarding or rebound.     Hernia: No hernia is present.  Musculoskeletal:        General: Normal range of motion.     Cervical back: Normal range of motion and neck supple. No rigidity. No muscular tenderness.     Right lower leg: No edema.     Left lower leg: No edema.  Lymphadenopathy:     Cervical: No cervical adenopathy.  Skin:    General: Skin is warm and dry.     Capillary Refill: Capillary refill takes less than 2 seconds.  Neurological:     General: No focal deficit present.     Mental Status: He is alert and oriented to person, place, and time. Mental status is at baseline.  Psychiatric:        Mood and Affect: Mood normal.        Behavior: Behavior normal.        Thought Content: Thought content normal.        Judgment: Judgment normal.    Lab Results  Component Value Date   TSH 1.340 01/08/2021   Lab Results  Component Value Date   WBC 10.6 05/09/2021   HGB 16.5 05/09/2021   HCT 47.1 05/09/2021   MCV 90 05/09/2021   PLT 327 05/09/2021   Lab Results  Component Value Date   NA 140 05/09/2021   K 4.0 05/09/2021   CO2 21 05/09/2021   GLUCOSE 103  (H) 05/09/2021   BUN 14 05/09/2021   CREATININE 0.92 05/09/2021   BILITOT 0.6 05/09/2021   ALKPHOS 159 (H) 05/09/2021   AST 13 05/09/2021   ALT 24 05/09/2021   PROT 6.5 05/09/2021   ALBUMIN 4.3 05/09/2021   CALCIUM 9.4 05/09/2021   EGFR 92 05/09/2021   Lab Results  Component Value Date   CHOL 125 05/09/2021   Lab Results  Component Value Date   HDL 37 (L) 05/09/2021   Lab Results  Component Value Date   LDLCALC 68 05/09/2021   Lab Results  Component Value Date   TRIG 106 05/09/2021   Lab Results  Component Value Date   CHOLHDL 3.4 05/09/2021   No results found for: HGBA1C

## 2021-10-16 LAB — LIPID PANEL
Chol/HDL Ratio: 3.5 ratio (ref 0.0–5.0)
Cholesterol, Total: 139 mg/dL (ref 100–199)
HDL: 40 mg/dL (ref >39)
LDL Chol Calc (NIH): 78 mg/dL (ref 0–99)
Triglycerides: 116 mg/dL (ref 0–149)
VLDL Cholesterol Cal: 21 mg/dL (ref 5–40)

## 2021-10-16 LAB — CMP14+EGFR
ALT: 24 IU/L (ref 0–44)
AST: 13 IU/L (ref 0–40)
Albumin/Globulin Ratio: 2.1 (ref 1.2–2.2)
Albumin: 4.4 g/dL (ref 3.8–4.8)
Alkaline Phosphatase: 161 IU/L — ABNORMAL HIGH (ref 44–121)
BUN/Creatinine Ratio: 14 (ref 10–24)
BUN: 15 mg/dL (ref 8–27)
Bilirubin Total: 0.4 mg/dL (ref 0.0–1.2)
CO2: 22 mmol/L (ref 20–29)
Calcium: 9.2 mg/dL (ref 8.6–10.2)
Chloride: 106 mmol/L (ref 96–106)
Creatinine, Ser: 1.07 mg/dL (ref 0.76–1.27)
Globulin, Total: 2.1 g/dL (ref 1.5–4.5)
Glucose: 125 mg/dL — ABNORMAL HIGH (ref 70–99)
Potassium: 4.3 mmol/L (ref 3.5–5.2)
Sodium: 142 mmol/L (ref 134–144)
Total Protein: 6.5 g/dL (ref 6.0–8.5)
eGFR: 77 mL/min/{1.73_m2} (ref >59)

## 2021-10-16 LAB — TSH: TSH: 1.6 u[IU]/mL (ref 0.450–4.500)

## 2021-10-16 LAB — CBC WITH DIFFERENTIAL/PLATELET
Basophils Absolute: 0.2 10*3/uL (ref 0.0–0.2)
Basos: 2 %
EOS (ABSOLUTE): 0.3 10*3/uL (ref 0.0–0.4)
Eos: 3 %
Hematocrit: 45.8 % (ref 37.5–51.0)
Hemoglobin: 16.2 g/dL (ref 13.0–17.7)
Immature Grans (Abs): 0 10*3/uL (ref 0.0–0.1)
Immature Granulocytes: 0 %
Lymphocytes Absolute: 2.5 10*3/uL (ref 0.7–3.1)
Lymphs: 28 %
MCH: 31.6 pg (ref 26.6–33.0)
MCHC: 35.4 g/dL (ref 31.5–35.7)
MCV: 89 fL (ref 79–97)
Monocytes Absolute: 0.7 10*3/uL (ref 0.1–0.9)
Monocytes: 8 %
Neutrophils Absolute: 5.1 10*3/uL (ref 1.4–7.0)
Neutrophils: 59 %
Platelets: 269 10*3/uL (ref 150–450)
RBC: 5.13 x10E6/uL (ref 4.14–5.80)
RDW: 12.4 % (ref 11.6–15.4)
WBC: 8.7 10*3/uL (ref 3.4–10.8)

## 2021-10-16 LAB — PSA, TOTAL AND FREE
PSA, Free Pct: 36.4 %
PSA, Free: 0.4 ng/mL
Prostate Specific Ag, Serum: 1.1 ng/mL (ref 0.0–4.0)

## 2021-10-17 LAB — SPECIMEN STATUS REPORT

## 2021-10-17 LAB — HGB A1C W/O EAG: Hgb A1c MFr Bld: 6.1 % — ABNORMAL HIGH (ref 4.8–5.6)

## 2021-10-18 ENCOUNTER — Encounter: Payer: Self-pay | Admitting: Family Medicine

## 2021-10-18 DIAGNOSIS — R7303 Prediabetes: Secondary | ICD-10-CM | POA: Insufficient documentation

## 2021-10-18 HISTORY — DX: Prediabetes: R73.03

## 2021-10-20 ENCOUNTER — Encounter: Payer: Self-pay | Admitting: Family Medicine

## 2021-10-20 MED ORDER — ALBUTEROL SULFATE HFA 108 (90 BASE) MCG/ACT IN AERS: 2.0000 | INHALATION_SPRAY | Freq: Four times a day (QID) | RESPIRATORY_TRACT | 5 refills | Status: DC | PRN

## 2021-10-24 ENCOUNTER — Telehealth: Payer: Self-pay | Admitting: *Deleted

## 2021-10-29 NOTE — Telephone Encounter (Signed)
° °  Telephone encounter was:  Successful.  10/29/2021 Name: Richard Velez MRN: [id number] DOB: [birth year]  Richard Velez is a 65 y.o. year old male who is a primary care patient of Loman Brooklyn, FNP . The community resource team was consulted for assistance [place] and Runner, broadcasting/film/video , wife needs insurance . Both have an income of 1600.00 a  month advised to apply for energy assistance   Care guide performed the following interventions: Patient provided with information about care guide support team and interviewed to confirm resource needs Follow up call placed to community resources to determine status of patients referral.  Follow Up Plan:  Care guide will follow up with patient by phone over the next week  [place], Care Management  [phone] [address] , [place] [address] Email : [email or web address]

## 2021-10-30 ENCOUNTER — Other Ambulatory Visit: Payer: Self-pay | Admitting: Family Medicine

## 2021-10-30 DIAGNOSIS — R42 Dizziness and giddiness: Secondary | ICD-10-CM

## 2021-10-30 NOTE — Telephone Encounter (Signed)
Last refill 03/26/21, #90, 3 refills Last office visit 10/15/21, [facility] patient

## 2021-11-07 ENCOUNTER — Telehealth: Payer: Self-pay | Admitting: *Deleted

## 2021-11-07 NOTE — Telephone Encounter (Signed)
° °  Telephone encounter was:  Successful.  11/07/2021 Name: Athen Riel MRN: [id number] DOB: 1956-05-30  Mclain Freer is a 66 y.o. year old male who is a primary care patient of Loman Brooklyn, FNP . The community resource team was consulted for assistance with Food InsecurityPatient is happy [facility] and using [facility] as well the utility help  and refused the food bank list so will close the case . He has [employer] assistance as well .   Care guide performed the following interventions: Patient provided with information about care guide support team and interviewed to confirm resource needs Follow up call placed to community resources to determine status of patients referral.  Follow Up Plan:  No further follow up planned at this time. The patient has been provided with needed resources. [place], Care Management  [phone] [address] , [place] [address] Email : [email or web address]

## 2021-11-26 ENCOUNTER — Ambulatory Visit (INDEPENDENT_AMBULATORY_CARE_PROVIDER_SITE_OTHER): Payer: Medicare Other | Admitting: Family Medicine

## 2021-11-26 ENCOUNTER — Encounter: Payer: Self-pay | Admitting: Family Medicine

## 2021-11-26 VITALS — BP 138/78 | HR 88 | Temp 97.6°F | Ht 68.0 in | Wt 183.4 lb

## 2021-11-26 DIAGNOSIS — J439 Emphysema, unspecified: Secondary | ICD-10-CM

## 2021-11-26 DIAGNOSIS — I10 Essential (primary) hypertension: Secondary | ICD-10-CM

## 2021-11-26 MED ORDER — UMECLIDINIUM-VILANTEROL 62.5-25 MCG/ACT IN AEPB
1.0000 | INHALATION_SPRAY | Freq: Every day | RESPIRATORY_TRACT | 2 refills | Status: DC
Start: 2021-11-26 — End: 2022-02-24

## 2021-11-26 MED ORDER — LOSARTAN POTASSIUM-HCTZ 50-12.5 MG PO TABS
1.0000 | ORAL_TABLET | Freq: Every day | ORAL | 1 refills | Status: DC
Start: 2021-11-26 — End: 2022-04-24

## 2021-11-26 NOTE — Addendum Note (Signed)
Addended by: Loman Brooklyn on: 11/26/2021 03:17 PM   Modules accepted: Orders

## 2021-11-26 NOTE — Progress Notes (Addendum)
Assessment & Plan:  1. Essential hypertension - well controlled, will combine losartan and HCTZ to reduce pill burden  2. Pulmonary emphysema, unspecified emphysema type ([place]) - improving but still uncontrolled, will trial Anoro - Discussed administration of inhalers at length including focus on daily vs rescue  - umeclidinium-vilanterol (ANORO ELLIPTA) 62.5-25 MCG/ACT AEPB; Inhale 1 puff into the lungs daily at 6 (six) AM.  Dispense: 60 each; Refill: 2   Return in about 4 weeks (around 12/24/2021) for COPD.  Lucile Crater, NP Student   I personally was present during the history, physical exam, and medical decision-making activities of this service and have verified that the service and findings are accurately documented in the nurse practitioner student's note.  [name], MSN, APRN, FNP-C [facility]  Subjective:    Patient ID: Richard Velez, male    DOB: 05/12/1956, 66 y.o.   MRN: [id number]  Patient Care Team: Loman Brooklyn, FNP as PCP - General (Family Medicine) Gala Romney Cristopher Estimable, MD as Consulting Physician (Gastroenterology)   Chief Complaint:  Chief Complaint  Patient presents with   COPD   Hypertension    6 week follow up    HPI: Richard Velez is a 66 y.o. male presenting on 11/26/2021 for COPD and Hypertension (6 week follow up)  Hypertension: He is controlled today. He has been taking 25 mg HCTZ and then 100 mg losartan.  COPD: he states he did better on Spiriva than using albuterol alone. He states that he has been using the Spiriva after dinner and again before bed. He states he ran out of the Spiriva and has been using albuterol BID since. Prior to running out, he stated that it did help but not enough.   New complaints: None   Social history:  Relevant past medical, surgical, family and social history reviewed and updated as indicated. Interim medical history since our last visit reviewed.  Allergies and medications reviewed  and updated.  DATA REVIEWED: CHART IN EPIC  ROS: Negative unless specifically indicated above in HPI.    Current Outpatient Medications:    albuterol (VENTOLIN HFA) 108 (90 Base) MCG/ACT inhaler, Inhale 2 puffs into the lungs every 6 (six) hours as needed for wheezing or shortness of breath., Disp: 18 g, Rfl: 5   aspirin 81 MG EC tablet, Take 1 tablet (81 mg total) by mouth daily. Swallow whole., Disp: 30 tablet, Rfl: 12   atorvastatin (LIPITOR) 10 MG tablet, Take 1 tablet (10 mg total) by mouth daily., Disp: 90 tablet, Rfl: 1   Carboxymethylcellulose Sodium (REFRESH PLUS OP), Place 1 drop into both eyes every 6 (six) hours as needed (dry eyes)., Disp: , Rfl:    losartan-hydrochlorothiazide (HYZAAR) 50-12.5 MG tablet, Take 1 tablet by mouth daily., Disp: 90 tablet, Rfl: 1   meclizine (ANTIVERT) 25 MG tablet, TAKE 1 TABLET 3 TIMES A DAY AS NEEDED FOR DIZZINESS, Disp: 90 tablet, Rfl: 3   omeprazole (PRILOSEC) 40 MG capsule, TAKE ONE CAPSULE TWICE DAILY, Disp: 60 capsule, Rfl: 5   umeclidinium-vilanterol (ANORO ELLIPTA) 62.5-25 MCG/ACT AEPB, Inhale 1 puff into the lungs daily at 6 (six) AM., Disp: 60 each, Rfl: 2   Allergies  Allergen Reactions   Lisinopril Cough   Oxycodone-Acetaminophen Itching and Nausea Only   Past Medical History:  Diagnosis Date   Aortic atherosclerosis ([place]) 02/19/2021   Cataract    Emphysema lung ([place]) 02/19/2021   Enlarged prostate 01/10/2021   Essential hypertension 01/10/2021   Gastroesophageal reflux disease 01/10/2021  History of kidney stones    Lung nodules    Benign in appearance on low-dose lung cancer screening CT on 02/19/2021   Mixed hyperlipidemia 01/11/2021   Prediabetes 10/18/2021    Past Surgical History:  Procedure Laterality Date   BIOPSY  02/27/2021   Procedure: BIOPSY;  Surgeon: [name], MD;  [facility];  Service: Endoscopy;;  gastric esophageal   COLONOSCOPY N/A 02/27/2021   Two 5 to 6 mm polyps at the ileocecal  valve (tubular adenomas), examination was otherwise normal.   ESOPHAGOGASTRODUODENOSCOPY N/A 02/27/2021   Distal esophageal stenosis/stricture.-**Dilated**   ESOPHAGOGASTRODUODENOSCOPY (EGD) WITH PROPOFOL N/A 09/12/2021   Procedure: ESOPHAGOGASTRODUODENOSCOPY (EGD) WITH PROPOFOL;  Surgeon: [name], MD;  [facility];  Service: Endoscopy;  Laterality: N/A;  2:45pm   EYE SURGERY Bilateral    cataract   MALONEY DILATION N/A 02/27/2021   Procedure: MALONEY DILATION;  Surgeon: [name], MD;  [facility];  Service: Endoscopy;  Laterality: N/A;   MALONEY DILATION N/A 09/12/2021   Procedure: Venia Minks DILATION;  Surgeon: [name], MD;  [facility];  Service: Endoscopy;  Laterality: N/A;   POLYPECTOMY  02/27/2021   Procedure: POLYPECTOMY;  Surgeon: [name], MD;  [facility];  Service: Endoscopy;;  colon    Social History   Socioeconomic History   Marital status: Married    Spouse name: Butch Penny   Number of children: Not on file   Years of education: Not on file   Highest education level: Not on file  Occupational History   Occupation: Retired    Comment: Delivered ice  Tobacco Use   Smoking status: Every Day    Packs/day: 1.00    Types: Cigarettes    Start date: 01/11/1971   Smokeless tobacco: Never  Vaping Use   Vaping Use: Never used  Substance and Sexual Activity   Alcohol use: Yes    Comment: occ   Drug use: Never   Sexual activity: Not on file  Other Topics Concern   Not on file  Social History Narrative   Not on file   Social Determinants of Health   Financial Resource Strain: High Risk   Difficulty of Paying Living Expenses: Hard  Food Insecurity: Food Insecurity Present   Worried About [place] in the Last Year: Sometimes true   [place] in the Last Year: Sometimes true  Transportation Needs: No Transportation Needs   Lack of Transportation (Medical): No   Lack of Transportation  (Non-Medical): No  Physical Activity: Insufficiently Active   Days of Exercise per Week: 4 days   Minutes of Exercise per Session: 20 min  Stress: No Stress Concern Present   Feeling of Stress : Only a little  Social Connections: Engineer, building services of Communication with Friends and Family: More than three times a week   Frequency of Social Gatherings with Friends and Family: More than three times a week   Attends Religious Services: More than 4 times per year   Active Member of [employer] or Organizations: Yes   Attends Music therapist: More than 4 times per year   Marital Status: Married  Human resources officer Violence: Not At Risk   Fear of Current or Ex-Partner: No   Emotionally Abused: No   Physically Abused: No   Sexually Abused: No        Objective:    BP 138/78    Pulse  88    Temp 97.6 F (36.4 C) (Temporal)    Ht '5\' 8"'  (1.727 m)    Wt 83.2 kg    SpO2 97%    BMI 27.89 kg/m   Wt Readings from Last 3 Encounters:  11/26/21 183 lb 6.4 oz (83.2 kg)  10/15/21 181 lb (82.1 kg)  10/09/21 180 lb (81.6 kg)    Physical Exam Vitals reviewed.  Constitutional:      General: He is not in acute distress.    Appearance: Normal appearance. He is not ill-appearing, toxic-appearing or diaphoretic.  HENT:     Head: Normocephalic and atraumatic.  Eyes:     General: No scleral icterus.       Right eye: No discharge.        Left eye: No discharge.     Conjunctiva/sclera: Conjunctivae normal.  Cardiovascular:     Rate and Rhythm: Normal rate and regular rhythm.     Heart sounds: Normal heart sounds. No murmur heard.   No friction rub. No gallop.  Pulmonary:     Effort: Pulmonary effort is normal. No respiratory distress.     Breath sounds: Normal breath sounds. No stridor. No wheezing, rhonchi or rales.  Musculoskeletal:        General: Normal range of motion.     Cervical back: Normal range of motion.     Right lower leg: No edema.     Left lower leg: No edema.   Skin:    General: Skin is warm and dry.  Neurological:     Mental Status: He is alert and oriented to person, place, and time. Mental status is at baseline.  Psychiatric:        Mood and Affect: Mood normal.        Behavior: Behavior normal.        Thought Content: Thought content normal.        Judgment: Judgment normal.   Lab Results  Component Value Date   TSH 1.600 10/15/2021   Lab Results  Component Value Date   WBC 8.7 10/15/2021   HGB 16.2 10/15/2021   HCT 45.8 10/15/2021   MCV 89 10/15/2021   PLT 269 10/15/2021   Lab Results  Component Value Date   NA 142 10/15/2021   K 4.3 10/15/2021   CO2 22 10/15/2021   GLUCOSE 125 (H) 10/15/2021   BUN 15 10/15/2021   CREATININE 1.07 10/15/2021   BILITOT 0.4 10/15/2021   ALKPHOS 161 (H) 10/15/2021   AST 13 10/15/2021   ALT 24 10/15/2021   PROT 6.5 10/15/2021   ALBUMIN 4.4 10/15/2021   CALCIUM 9.2 10/15/2021   EGFR 77 10/15/2021   Lab Results  Component Value Date   CHOL 139 10/15/2021   Lab Results  Component Value Date   HDL 40 10/15/2021   Lab Results  Component Value Date   LDLCALC 78 10/15/2021   Lab Results  Component Value Date   TRIG 116 10/15/2021   Lab Results  Component Value Date   CHOLHDL 3.5 10/15/2021   Lab Results  Component Value Date   HGBA1C 6.1 (H) 10/15/2021

## 2021-12-24 ENCOUNTER — Encounter: Payer: Self-pay | Admitting: Family Medicine

## 2021-12-24 ENCOUNTER — Ambulatory Visit (INDEPENDENT_AMBULATORY_CARE_PROVIDER_SITE_OTHER): Payer: Medicare Other | Admitting: Family Medicine

## 2021-12-24 VITALS — BP 139/86 | HR 88 | Temp 98.5°F | Ht 68.0 in | Wt 179.0 lb

## 2021-12-24 DIAGNOSIS — J439 Emphysema, unspecified: Secondary | ICD-10-CM | POA: Diagnosis not present

## 2021-12-24 NOTE — Progress Notes (Signed)
Assessment & Plan:  1. Pulmonary emphysema, unspecified emphysema type ([place]) Well controlled on current regimen.    Return in about 4 months (around 04/23/2022) for follow-up of chronic medication conditions.  Richard Limes, MSN, APRN, FNP-C [facility]  Subjective:    Patient ID: Richard Velez, male    DOB: 03/24/1956, 66 y.o.   MRN: [id number]  Patient Care Team: Loman Brooklyn, FNP as PCP - General (Family Medicine) Gala Romney Cristopher Estimable, MD as Consulting Physician (Gastroenterology)   Chief Complaint:  Chief Complaint  Patient presents with   COPD    4 week follow up - Patient states that it has improved.     HPI: Richard Velez is a 66 y.o. male presenting on 12/24/2021 for COPD (4 week follow up - Patient states that it has improved. )  COPD: Patient was started on Anoro at our last visit approximately 4 weeks ago.  He previously failed treatment with Spiriva.  He does have albuterol to use as needed, which is sometimes during the day.  His usage has significantly decreased since being on Anoro.  New complaints: None   Social history:  Relevant past medical, surgical, family and social history reviewed and updated as indicated. Interim medical history since our last visit reviewed.  Allergies and medications reviewed and updated.  DATA REVIEWED: CHART IN EPIC  ROS: Negative unless specifically indicated above in HPI.    Current Outpatient Medications:    albuterol (VENTOLIN HFA) 108 (90 Base) MCG/ACT inhaler, Inhale 2 puffs into the lungs every 6 (six) hours as needed for wheezing or shortness of breath., Disp: 18 g, Rfl: 5   aspirin 81 MG EC tablet, Take 1 tablet (81 mg total) by mouth daily. Swallow whole., Disp: 30 tablet, Rfl: 12   atorvastatin (LIPITOR) 10 MG tablet, Take 1 tablet (10 mg total) by mouth daily., Disp: 90 tablet, Rfl: 1   Carboxymethylcellulose Sodium (REFRESH PLUS OP), Place 1 drop into both eyes every 6 (six) hours as  needed (dry eyes)., Disp: , Rfl:    losartan-hydrochlorothiazide (HYZAAR) 50-12.5 MG tablet, Take 1 tablet by mouth daily., Disp: 90 tablet, Rfl: 1   meclizine (ANTIVERT) 25 MG tablet, TAKE 1 TABLET 3 TIMES A DAY AS NEEDED FOR DIZZINESS, Disp: 90 tablet, Rfl: 3   omeprazole (PRILOSEC) 40 MG capsule, TAKE ONE CAPSULE TWICE DAILY, Disp: 60 capsule, Rfl: 5   umeclidinium-vilanterol (ANORO ELLIPTA) 62.5-25 MCG/ACT AEPB, Inhale 1 puff into the lungs daily at 6 (six) AM., Disp: 60 each, Rfl: 2   Allergies  Allergen Reactions   Lisinopril Cough   Oxycodone-Acetaminophen Itching and Nausea Only   Past Medical History:  Diagnosis Date   Aortic atherosclerosis ([facility]) 02/19/2021   Cataract    Emphysema lung ([place]) 02/19/2021   Enlarged prostate 01/10/2021   Essential hypertension 01/10/2021   Gastroesophageal reflux disease 01/10/2021   History of kidney stones    Lung nodules    Benign in appearance on low-dose lung cancer screening CT on 02/19/2021   Mixed hyperlipidemia 01/11/2021   Prediabetes 10/18/2021    Past Surgical History:  Procedure Laterality Date   BIOPSY  02/27/2021   Procedure: BIOPSY;  Surgeon: [name], MD;  [facility];  Service: Endoscopy;;  gastric esophageal   COLONOSCOPY N/A 02/27/2021   Two 5 to 6 mm polyps at the ileocecal valve (tubular adenomas), examination was otherwise normal.   ESOPHAGOGASTRODUODENOSCOPY N/A 02/27/2021   Distal esophageal stenosis/stricture.-**Dilated**   ESOPHAGOGASTRODUODENOSCOPY (EGD) WITH PROPOFOL  N/A 09/12/2021   Procedure: ESOPHAGOGASTRODUODENOSCOPY (EGD) WITH PROPOFOL;  Surgeon: [name], MD;  [facility];  Service: Endoscopy;  Laterality: N/A;  2:45pm   EYE SURGERY Bilateral    cataract   MALONEY DILATION N/A 02/27/2021   Procedure: MALONEY DILATION;  Surgeon: [name], MD;  [facility];  Service: Endoscopy;  Laterality: N/A;   MALONEY DILATION N/A 09/12/2021   Procedure:  Venia Minks DILATION;  Surgeon: [name], MD;  [facility];  Service: Endoscopy;  Laterality: N/A;   POLYPECTOMY  02/27/2021   Procedure: POLYPECTOMY;  Surgeon: [name], MD;  [facility];  Service: Endoscopy;;  colon    Social History   Socioeconomic History   Marital status: Married    Spouse name: Butch Penny   Number of children: Not on file   Years of education: Not on file   Highest education level: Not on file  Occupational History   Occupation: Retired    Comment: Delivered ice  Tobacco Use   Smoking status: Every Day    Packs/day: 1.00    Types: Cigarettes    Start date: 01/11/1971   Smokeless tobacco: Never  Vaping Use   Vaping Use: Never used  Substance and Sexual Activity   Alcohol use: Yes    Comment: occ   Drug use: Never   Sexual activity: Not on file  Other Topics Concern   Not on file  Social History Narrative   Not on file   Social Determinants of Health   Financial Resource Strain: High Risk   Difficulty of Paying Living Expenses: Hard  Food Insecurity: Food Insecurity Present   Worried About [place] in the Last Year: Sometimes true   [place] in the Last Year: Sometimes true  Transportation Needs: No Transportation Needs   Lack of Transportation (Medical): No   Lack of Transportation (Non-Medical): No  Physical Activity: Insufficiently Active   Days of Exercise per Week: 4 days   Minutes of Exercise per Session: 20 min  Stress: No Stress Concern Present   Feeling of Stress : Only a little  Social Connections: Engineer, building services of Communication with Friends and Family: More than three times a week   Frequency of Social Gatherings with Friends and Family: More than three times a week   Attends Religious Services: More than 4 times per year   Active Member of [employer] or Organizations: Yes   Attends Music therapist: More than 4 times per year   Marital Status: Married   Human resources officer Violence: Not At Risk   Fear of Current or Ex-Partner: No   Emotionally Abused: No   Physically Abused: No   Sexually Abused: No        Objective:    BP 139/86    Pulse 88    Temp 98.5 F (36.9 C) (Temporal)    Ht '5\' 8"'  (1.727 m)    Wt 179 lb (81.2 kg)    SpO2 95%    BMI 27.22 kg/m   Wt Readings from Last 3 Encounters:  12/24/21 179 lb (81.2 kg)  11/26/21 183 lb 6.4 oz (83.2 kg)  10/15/21 181 lb (82.1 kg)    Physical Exam Vitals reviewed.  Constitutional:      General: He is not in acute distress.    Appearance: Normal appearance. He is not ill-appearing, toxic-appearing or diaphoretic.  HENT:     Head: Normocephalic  and atraumatic.  Eyes:     General: No scleral icterus.       Right eye: No discharge.        Left eye: No discharge.     Conjunctiva/sclera: Conjunctivae normal.  Cardiovascular:     Rate and Rhythm: Normal rate and regular rhythm.     Heart sounds: Normal heart sounds. No murmur heard.   No friction rub. No gallop.  Pulmonary:     Effort: Pulmonary effort is normal. No respiratory distress.     Breath sounds: Normal breath sounds. No stridor. No wheezing, rhonchi or rales.  Musculoskeletal:        General: Normal range of motion.     Cervical back: Normal range of motion.     Right lower leg: No edema.     Left lower leg: No edema.  Skin:    General: Skin is warm and dry.  Neurological:     Mental Status: He is alert and oriented to person, place, and time. Mental status is at baseline.  Psychiatric:        Mood and Affect: Mood normal.        Behavior: Behavior normal.        Thought Content: Thought content normal.        Judgment: Judgment normal.   Lab Results  Component Value Date   TSH 1.600 10/15/2021   Lab Results  Component Value Date   WBC 8.7 10/15/2021   HGB 16.2 10/15/2021   HCT 45.8 10/15/2021   MCV 89 10/15/2021   PLT 269 10/15/2021   Lab Results  Component Value Date   NA 142 10/15/2021   K 4.3  10/15/2021   CO2 22 10/15/2021   GLUCOSE 125 (H) 10/15/2021   BUN 15 10/15/2021   CREATININE 1.07 10/15/2021   BILITOT 0.4 10/15/2021   ALKPHOS 161 (H) 10/15/2021   AST 13 10/15/2021   ALT 24 10/15/2021   PROT 6.5 10/15/2021   ALBUMIN 4.4 10/15/2021   CALCIUM 9.2 10/15/2021   EGFR 77 10/15/2021   Lab Results  Component Value Date   CHOL 139 10/15/2021   Lab Results  Component Value Date   HDL 40 10/15/2021   Lab Results  Component Value Date   LDLCALC 78 10/15/2021   Lab Results  Component Value Date   TRIG 116 10/15/2021   Lab Results  Component Value Date   CHOLHDL 3.5 10/15/2021   Lab Results  Component Value Date   HGBA1C 6.1 (H) 10/15/2021

## 2021-12-30 ENCOUNTER — Encounter: Payer: Self-pay | Admitting: Family Medicine

## 2022-01-21 ENCOUNTER — Encounter: Payer: Self-pay | Admitting: Internal Medicine

## 2022-02-24 ENCOUNTER — Other Ambulatory Visit: Payer: Self-pay | Admitting: Family Medicine

## 2022-02-24 DIAGNOSIS — J439 Emphysema, unspecified: Secondary | ICD-10-CM

## 2022-03-17 ENCOUNTER — Other Ambulatory Visit: Payer: Self-pay | Admitting: Gastroenterology

## 2022-03-25 ENCOUNTER — Ambulatory Visit: Payer: Medicare Other | Admitting: Family Medicine

## 2022-04-23 ENCOUNTER — Other Ambulatory Visit: Payer: Self-pay | Admitting: Family Medicine

## 2022-04-23 DIAGNOSIS — J439 Emphysema, unspecified: Secondary | ICD-10-CM

## 2022-04-24 ENCOUNTER — Encounter: Payer: Self-pay | Admitting: Family Medicine

## 2022-04-24 ENCOUNTER — Ambulatory Visit (INDEPENDENT_AMBULATORY_CARE_PROVIDER_SITE_OTHER): Payer: Medicare Other | Admitting: Family Medicine

## 2022-04-24 VITALS — BP 140/79 | HR 84 | Temp 99.0°F | Ht 68.0 in | Wt 182.2 lb

## 2022-04-24 DIAGNOSIS — L0291 Cutaneous abscess, unspecified: Secondary | ICD-10-CM

## 2022-04-24 DIAGNOSIS — Z720 Tobacco use: Secondary | ICD-10-CM

## 2022-04-24 DIAGNOSIS — I10 Essential (primary) hypertension: Secondary | ICD-10-CM

## 2022-04-24 DIAGNOSIS — Z566 Other physical and mental strain related to work: Secondary | ICD-10-CM

## 2022-04-24 DIAGNOSIS — E782 Mixed hyperlipidemia: Secondary | ICD-10-CM

## 2022-04-24 DIAGNOSIS — J439 Emphysema, unspecified: Secondary | ICD-10-CM

## 2022-04-24 DIAGNOSIS — I700 Atherosclerosis of aorta: Secondary | ICD-10-CM | POA: Diagnosis not present

## 2022-04-24 DIAGNOSIS — R7303 Prediabetes: Secondary | ICD-10-CM

## 2022-04-24 DIAGNOSIS — K219 Gastro-esophageal reflux disease without esophagitis: Secondary | ICD-10-CM

## 2022-04-24 MED ORDER — LOSARTAN POTASSIUM-HCTZ 50-12.5 MG PO TABS: 1.0000 | ORAL_TABLET | Freq: Every day | ORAL | 1 refills | Status: DC

## 2022-04-24 MED ORDER — ATORVASTATIN CALCIUM 10 MG PO TABS: 10.0000 mg | ORAL_TABLET | Freq: Every day | ORAL | 1 refills | Status: DC

## 2022-04-24 MED ORDER — SULFAMETHOXAZOLE-TRIMETHOPRIM 800-160 MG PO TABS: 1.0000 | ORAL_TABLET | Freq: Two times a day (BID) | ORAL | 0 refills | Status: AC

## 2022-04-24 MED ORDER — TRELEGY ELLIPTA 100-62.5-25 MCG/ACT IN AEPB: 1.0000 | INHALATION_SPRAY | Freq: Every day | RESPIRATORY_TRACT | 5 refills | Status: DC

## 2022-04-24 NOTE — Progress Notes (Unsigned)
Assessment & Plan:  ***   No follow-ups on file.  [name], MSN, APRN, FNP-C [facility]  Subjective:    Patient ID: Richard Velez, male    DOB: April 21, 1956, 66 y.o.   MRN: [id number]  Patient Care Team: Loman Brooklyn, FNP as PCP - General (Family Medicine) Gala Romney Cristopher Estimable, MD as Consulting Physician (Gastroenterology)   Chief Complaint:  Chief Complaint  Patient presents with   Medical Management of Chronic Issues    HPI: [name] is a 66 y.o. male presenting on 04/24/2022 for Medical Management of Chronic Issues  Hypertension: He is controlled today. He has been taking 25 mg HCTZ and then 100 mg losartan.  COPD: he states he did better on Spiriva than using albuterol alone. He states that he has been using the Spiriva after dinner and again before bed. He states he ran out of the Spiriva and has been using albuterol BID since. Prior to running out, he stated that it did help but not enough.   New complaints: None   Social history:  Relevant past medical, surgical, family and social history reviewed and updated as indicated. Interim medical history since our last visit reviewed.  Allergies and medications reviewed and updated.  DATA REVIEWED: CHART IN EPIC  ROS: Negative unless specifically indicated above in HPI.    Current Outpatient Medications:    albuterol (VENTOLIN HFA) 108 (90 Base) MCG/ACT inhaler, Inhale 2 puffs into the lungs every 6 (six) hours as needed for wheezing or shortness of breath., Disp: 18 g, Rfl: 5   ANORO ELLIPTA 62.5-25 MCG/ACT AEPB, INHALE 1 PUFF DAILY AT 6AM, Disp: 60 each, Rfl: 0   aspirin 81 MG EC tablet, Take 1 tablet (81 mg total) by mouth daily. Swallow whole., Disp: 30 tablet, Rfl: 12   atorvastatin (LIPITOR) 10 MG tablet, Take 1 tablet (10 mg total) by mouth daily., Disp: 90 tablet, Rfl: 1   Carboxymethylcellulose Sodium (REFRESH PLUS OP), Place 1 drop into both eyes every 6 (six) hours as needed  (dry eyes)., Disp: , Rfl:    losartan-hydrochlorothiazide (HYZAAR) 50-12.5 MG tablet, Take 1 tablet by mouth daily., Disp: 90 tablet, Rfl: 1   meclizine (ANTIVERT) 25 MG tablet, TAKE 1 TABLET 3 TIMES A DAY AS NEEDED FOR DIZZINESS, Disp: 90 tablet, Rfl: 3   omeprazole (PRILOSEC) 40 MG capsule, TAKE ONE CAPSULE TWICE DAILY, Disp: 60 capsule, Rfl: 5   Allergies  Allergen Reactions   Lisinopril Cough   Oxycodone-Acetaminophen Itching and Nausea Only   Past Medical History:  Diagnosis Date   Aortic atherosclerosis (HCC) 02/19/2021   Cataract    Emphysema lung ([place]) 02/19/2021   Enlarged prostate 01/10/2021   Essential hypertension 01/10/2021   Gastroesophageal reflux disease 01/10/2021   History of kidney stones    Lung nodules    Benign in appearance on low-dose lung cancer screening CT on 02/19/2021   Mixed hyperlipidemia 01/11/2021   Prediabetes 10/18/2021    Past Surgical History:  Procedure Laterality Date   BIOPSY  02/27/2021   Procedure: BIOPSY;  Surgeon: [name], MD;  [facility];  Service: Endoscopy;;  gastric esophageal   COLONOSCOPY N/A 02/27/2021   Two 5 to 6 mm polyps at the ileocecal valve (tubular adenomas), examination was otherwise normal.   ESOPHAGOGASTRODUODENOSCOPY N/A 02/27/2021   Distal esophageal stenosis/stricture.-**Dilated**   ESOPHAGOGASTRODUODENOSCOPY (EGD) WITH PROPOFOL N/A 09/12/2021   Procedure: ESOPHAGOGASTRODUODENOSCOPY (EGD) WITH PROPOFOL;  Surgeon: [name], MD;  [facility];  Service: Endoscopy;  Laterality: N/A;  2:45pm   EYE SURGERY Bilateral    cataract   MALONEY DILATION N/A 02/27/2021   Procedure: MALONEY DILATION;  Surgeon: [name], MD;  [facility];  Service: Endoscopy;  Laterality: N/A;   MALONEY DILATION N/A 09/12/2021   Procedure: Venia Minks DILATION;  Surgeon: [name], MD;  [facility];  Service: Endoscopy;  Laterality: N/A;   POLYPECTOMY  02/27/2021    Procedure: POLYPECTOMY;  Surgeon: [name], MD;  [facility];  Service: Endoscopy;;  colon    Social History   Socioeconomic History   Marital status: Married    Spouse name: Butch Penny   Number of children: Not on file   Years of education: Not on file   Highest education level: Not on file  Occupational History   Occupation: Retired    Comment: Delivered ice  Tobacco Use   Smoking status: Every Day    Packs/day: 1.00    Types: Cigarettes    Start date: 01/11/1971   Smokeless tobacco: Never  Vaping Use   Vaping Use: Never used  Substance and Sexual Activity   Alcohol use: Yes    Comment: occ   Drug use: Never   Sexual activity: Not on file  Other Topics Concern   Not on file  Social History Narrative   Not on file   Social Determinants of Health   Financial Resource Strain: High Risk (10/29/2021)   Overall Financial Resource Strain (CARDIA)    Difficulty of Paying Living Expenses: Hard  Food Insecurity: Food Insecurity Present (10/29/2021)   Hunger Vital Sign    Worried About [place] in the Last Year: Sometimes true    Ran Out of Food in the Last Year: Sometimes true  Transportation Needs: No Transportation Needs (10/29/2021)   PRAPARE - Hydrologist (Medical): No    Lack of Transportation (Non-Medical): No  Physical Activity: Insufficiently Active (10/09/2021)   Exercise Vital Sign    Days of Exercise per Week: 4 days    Minutes of Exercise per Session: 20 min  Stress: No Stress Concern Present (10/09/2021)   [place]    Feeling of Stress : Only a little  Social Connections: Socially Integrated (10/09/2021)   Social Connection and Isolation Panel [NHANES]    Frequency of Communication with Friends and Family: More than three times a week    Frequency of Social Gatherings with Friends and Family: More than three times a week    Attends Religious  Services: More than 4 times per year    Active Member of [employer] or Organizations: Yes    Attends Music therapist: More than 4 times per year    Marital Status: Married  Human resources officer Violence: Not At Risk (10/09/2021)   Humiliation, Afraid, Rape, and Kick questionnaire    Fear of Current or Ex-Partner: No    Emotionally Abused: No    Physically Abused: No    Sexually Abused: No        Objective:    BP 140/79   Pulse 84   Temp 99 F (37.2 C) (Temporal)   Ht '5\' 8"'  (1.727 m)   Wt 182 lb 3.2 oz (82.6 kg)   SpO2 96%   BMI 27.70 kg/m   Wt Readings from Last 3 Encounters:  04/24/22 182 lb 3.2 oz (82.6 kg)  12/24/21  179 lb (81.2 kg)  11/26/21 183 lb 6.4 oz (83.2 kg)    Physical Exam Vitals reviewed.  Constitutional:      General: He is not in acute distress.    Appearance: Normal appearance. He is not ill-appearing, toxic-appearing or diaphoretic.  HENT:     Head: Normocephalic and atraumatic.  Eyes:     General: No scleral icterus.       Right eye: No discharge.        Left eye: No discharge.     Conjunctiva/sclera: Conjunctivae normal.  Cardiovascular:     Rate and Rhythm: Normal rate and regular rhythm.     Heart sounds: Normal heart sounds. No murmur heard.    No friction rub. No gallop.  Pulmonary:     Effort: Pulmonary effort is normal. No respiratory distress.     Breath sounds: Normal breath sounds. No stridor. No wheezing, rhonchi or rales.  Musculoskeletal:        General: Normal range of motion.     Cervical back: Normal range of motion.     Right lower leg: No edema.     Left lower leg: No edema.  Skin:    General: Skin is warm and dry.  Neurological:     Mental Status: He is alert and oriented to person, place, and time. Mental status is at baseline.  Psychiatric:        Mood and Affect: Mood normal.        Behavior: Behavior normal.        Thought Content: Thought content normal.        Judgment: Judgment normal.    Lab Results   Component Value Date   TSH 1.600 10/15/2021   Lab Results  Component Value Date   WBC 8.7 10/15/2021   HGB 16.2 10/15/2021   HCT 45.8 10/15/2021   MCV 89 10/15/2021   PLT 269 10/15/2021   Lab Results  Component Value Date   NA 142 10/15/2021   K 4.3 10/15/2021   CO2 22 10/15/2021   GLUCOSE 125 (H) 10/15/2021   BUN 15 10/15/2021   CREATININE 1.07 10/15/2021   BILITOT 0.4 10/15/2021   ALKPHOS 161 (H) 10/15/2021   AST 13 10/15/2021   ALT 24 10/15/2021   PROT 6.5 10/15/2021   ALBUMIN 4.4 10/15/2021   CALCIUM 9.2 10/15/2021   EGFR 77 10/15/2021   Lab Results  Component Value Date   CHOL 139 10/15/2021   Lab Results  Component Value Date   HDL 40 10/15/2021   Lab Results  Component Value Date   LDLCALC 78 10/15/2021   Lab Results  Component Value Date   TRIG 116 10/15/2021   Lab Results  Component Value Date   CHOLHDL 3.5 10/15/2021   Lab Results  Component Value Date   HGBA1C 6.1 (H) 10/15/2021

## 2022-04-25 LAB — CBC WITH DIFFERENTIAL/PLATELET
Basophils Absolute: 0.2 10*3/uL (ref 0.0–0.2)
Basos: 2 %
EOS (ABSOLUTE): 0.2 10*3/uL (ref 0.0–0.4)
Eos: 2 %
Hematocrit: 50 % (ref 37.5–51.0)
Hemoglobin: 17.6 g/dL (ref 13.0–17.7)
Immature Grans (Abs): 0.1 10*3/uL (ref 0.0–0.1)
Immature Granulocytes: 1 %
Lymphocytes Absolute: 2.9 10*3/uL (ref 0.7–3.1)
Lymphs: 27 %
MCH: 31.2 pg (ref 26.6–33.0)
MCHC: 35.2 g/dL (ref 31.5–35.7)
MCV: 89 fL (ref 79–97)
Monocytes Absolute: 0.9 10*3/uL (ref 0.1–0.9)
Monocytes: 8 %
Neutrophils Absolute: 6.6 10*3/uL (ref 1.4–7.0)
Neutrophils: 60 %
Platelets: 371 10*3/uL (ref 150–450)
RBC: 5.64 x10E6/uL (ref 4.14–5.80)
RDW: 12.5 % (ref 11.6–15.4)
WBC: 10.8 10*3/uL (ref 3.4–10.8)

## 2022-04-25 LAB — LIPID PANEL
Chol/HDL Ratio: 3.3 ratio (ref 0.0–5.0)
Cholesterol, Total: 131 mg/dL (ref 100–199)
HDL: 40 mg/dL (ref >39)
LDL Chol Calc (NIH): 65 mg/dL (ref 0–99)
Triglycerides: 151 mg/dL — ABNORMAL HIGH (ref 0–149)
VLDL Cholesterol Cal: 26 mg/dL (ref 5–40)

## 2022-04-25 LAB — CMP14+EGFR
ALT: 23 IU/L (ref 0–44)
AST: 16 IU/L (ref 0–40)
Albumin/Globulin Ratio: 1.8 (ref 1.2–2.2)
Albumin: 4.5 g/dL (ref 3.8–4.8)
Alkaline Phosphatase: 151 IU/L — ABNORMAL HIGH (ref 44–121)
BUN/Creatinine Ratio: 12 (ref 10–24)
BUN: 14 mg/dL (ref 8–27)
Bilirubin Total: 0.6 mg/dL (ref 0.0–1.2)
CO2: 22 mmol/L (ref 20–29)
Calcium: 9.7 mg/dL (ref 8.6–10.2)
Chloride: 101 mmol/L (ref 96–106)
Creatinine, Ser: 1.14 mg/dL (ref 0.76–1.27)
Globulin, Total: 2.5 g/dL (ref 1.5–4.5)
Glucose: 111 mg/dL — ABNORMAL HIGH (ref 70–99)
Potassium: 3.9 mmol/L (ref 3.5–5.2)
Sodium: 142 mmol/L (ref 134–144)
Total Protein: 7 g/dL (ref 6.0–8.5)
eGFR: 71 mL/min/{1.73_m2} (ref >59)

## 2022-04-25 LAB — BAYER DCA HB A1C WAIVED: HB A1C (BAYER DCA - WAIVED): 5.7 % — ABNORMAL HIGH (ref 4.8–5.6)

## 2022-05-07 IMAGING — RF DG ESOPHAGUS
8 of 10 series · 14 of 24 positions shown · non-contrast
Comparison: None.

CLINICAL DATA: Dysphagia. Esophageal dilatation GE junction
02/27/2021

EXAM:
ESOPHOGRAM / BARIUM SWALLOW / BARIUM TABLET STUDY
TECHNIQUE: Combined double contrast and single contrast examination performed
using effervescent crystals, thick barium liquid, and thin barium
liquid. The patient was observed with fluoroscopy swallowing a 13 mm
barium sulphate tablet.
FLUOROSCOPY TIME:  Fluoroscopy Time:  2 minute 30 seconds
Radiation Exposure Index (if provided by the fluoroscopic device):
Number of Acquired Spot Images: 10

[Series 1: cp_standard · 0.29mm/px · 2 of 220 frames shown (1 of 8)]
[frame 34/220]
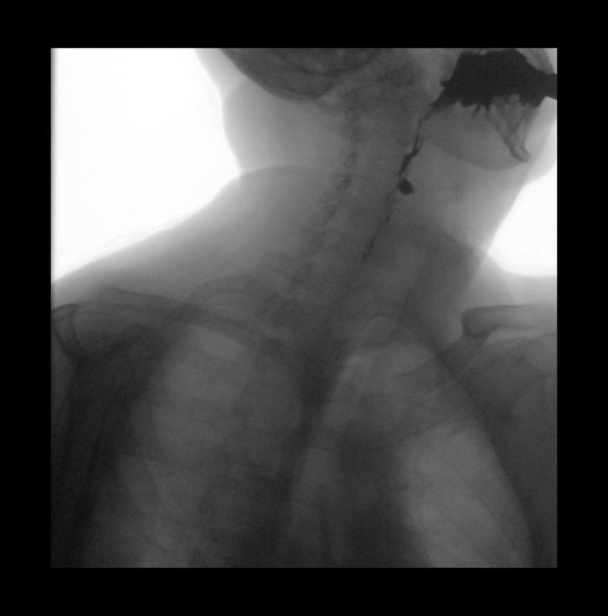
[frame 140/220]
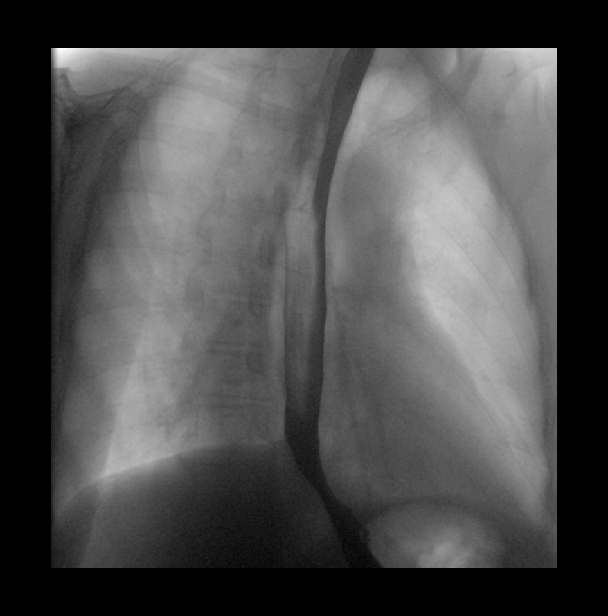

[Series 2: cp_standard · 0.30mm/px · 3 of 108 frames shown (2 of 8)]
[frame 17/108]
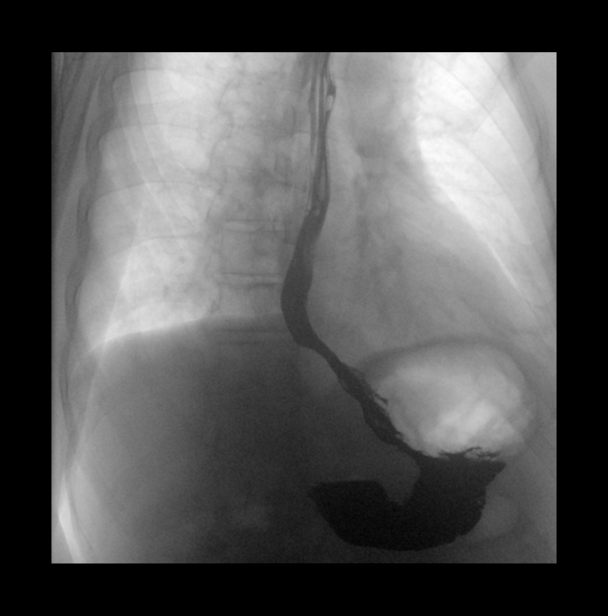
[frame 56/108]
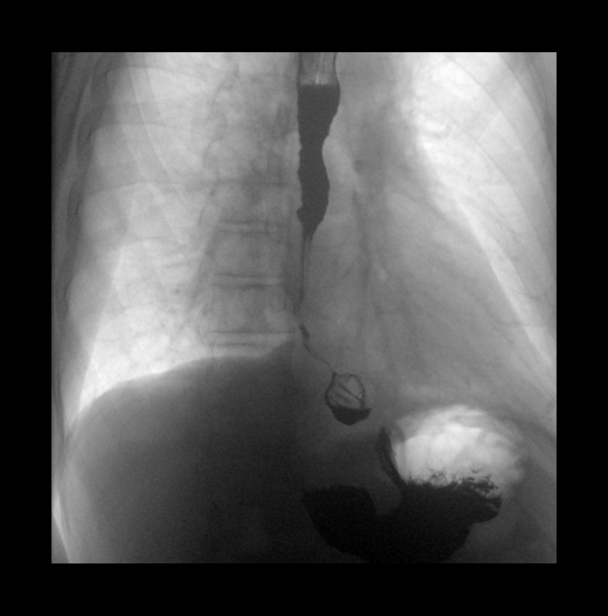
[frame 92/108]
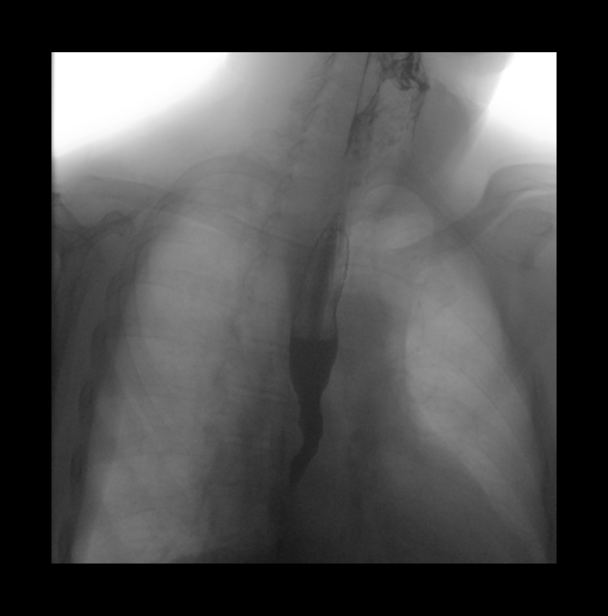

[Series 3: cp_standard · 0.27mm/px · 2 of 154 frames shown (3 of 8)]
[frame 78/154]
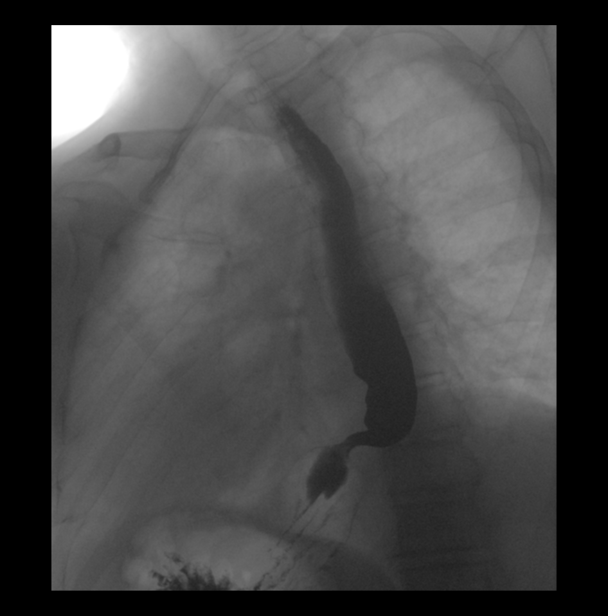
[frame 140/154]
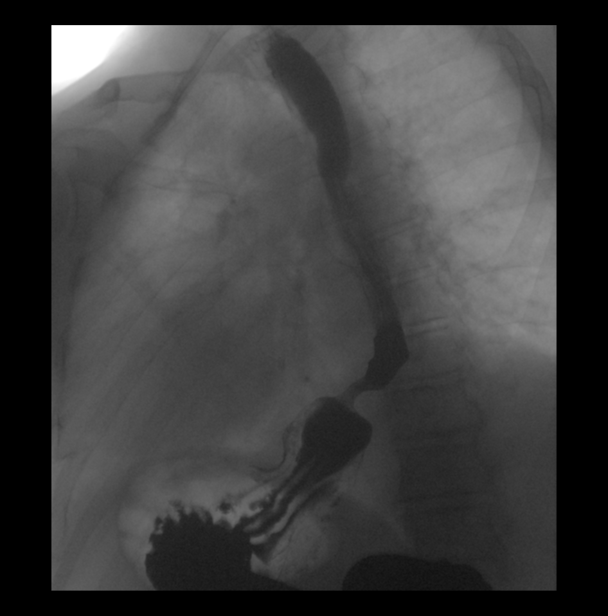

[Series 4: cp_standard · 0.27mm/px · 2 of 184 frames shown (4 of 8)]
[frame 49/184]
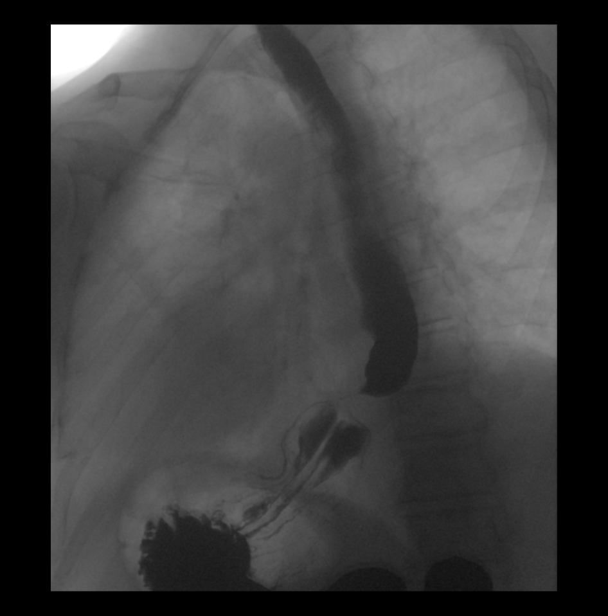
[frame 157/184]
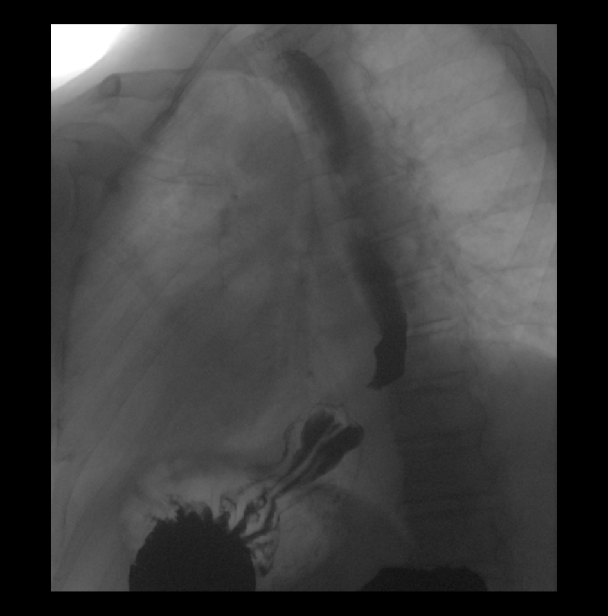

[Series 5: cp_standard · 0.27mm/px · 2 of 141 frames shown (5 of 8)]
[frame 52/141]
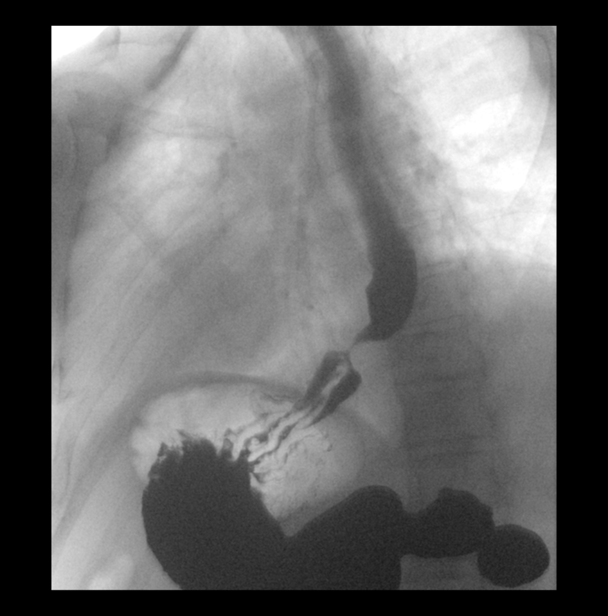
[frame 120/141]
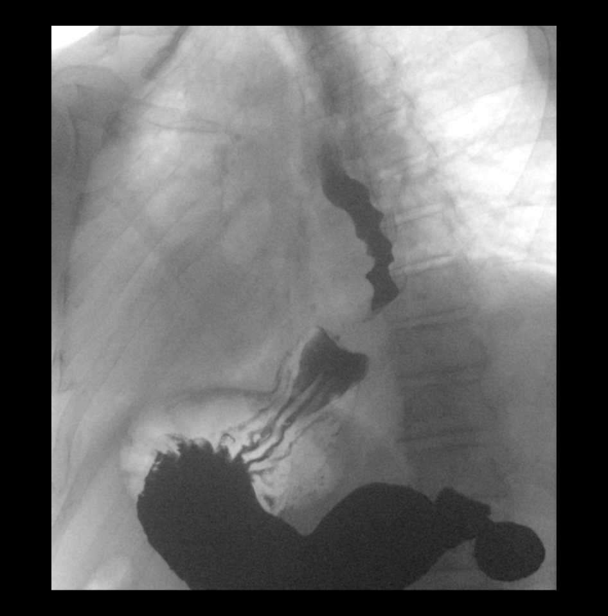

[Series 6: cp_standard · 0.26mm/px · 1 of 1 slices shown (6 of 8)]
[im 1/1]
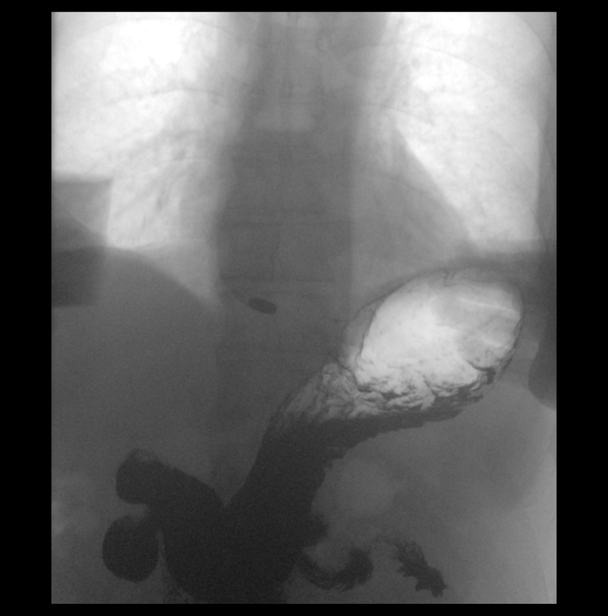

[Series 8: cp_standard · 0.27mm/px · 1 of 1 slices shown (7 of 8)]
[im 1/1]
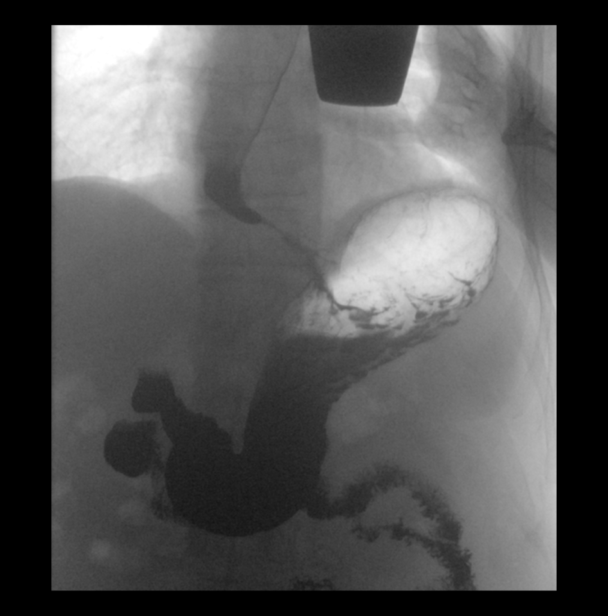

[Series 10: cp_standard · 0.27mm/px · 1 of 1 slices shown (8 of 8)]
[im 1/1]
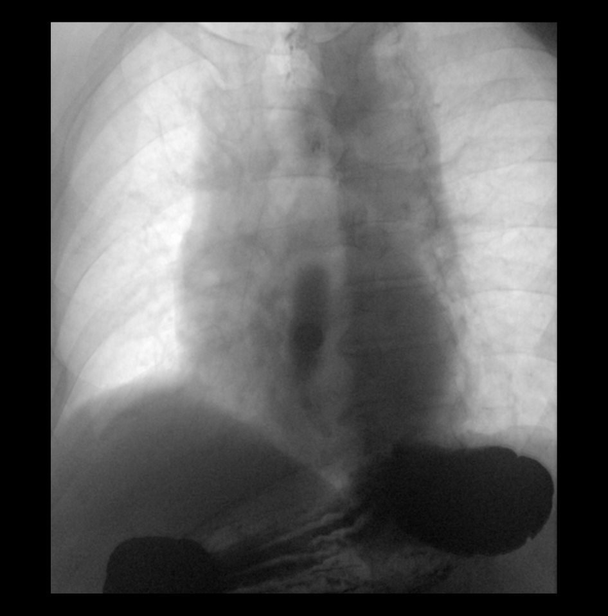

[14 of 24 positions shown; findings below may reference images not displayed]

FINDINGS: Decreased esophageal motility especially distally. Normal pharyngeal
swallowing. No aspiration.

Mild to moderate hiatal hernia. There is a moderate stricture at the
GE junction just above the hiatal hernia. Barium tablet did not pass
through this area despite multiple sips of water and thin barium.
There was mild gastroesophageal reflux while drinking water.
IMPRESSION: Mild to moderate hiatal hernia. Moderate esophageal stricture above
the hiatal hernia. Barium tablet did not pass through this area.

Mild gastroesophageal reflux.

## 2022-06-05 ENCOUNTER — Encounter: Payer: Self-pay | Admitting: Family Medicine

## 2022-06-05 ENCOUNTER — Ambulatory Visit (INDEPENDENT_AMBULATORY_CARE_PROVIDER_SITE_OTHER): Payer: Medicare Other | Admitting: Family Medicine

## 2022-06-05 VITALS — BP 143/79 | HR 75 | Temp 97.7°F | Ht 68.0 in | Wt 179.8 lb

## 2022-06-05 DIAGNOSIS — R252 Cramp and spasm: Secondary | ICD-10-CM | POA: Diagnosis not present

## 2022-06-05 DIAGNOSIS — J449 Chronic obstructive pulmonary disease, unspecified: Secondary | ICD-10-CM | POA: Diagnosis not present

## 2022-06-05 NOTE — Progress Notes (Signed)
Assessment & Plan:  1. COPD without exacerbation ([place]) Well controlled on current regimen.   2. Leg cramping - TSH - Magnesium   Return in about 6 months (around 12/06/2022) for annual physical.  [name], MSN, APRN, FNP-C [facility]  Subjective:    Patient ID: Richard Velez, male    DOB: [birth year], 66 y.o.   MRN: [id number]  Patient Care Team: Loman Brooklyn, FNP as PCP - General (Family Medicine) Gala Romney Cristopher Estimable, MD as Consulting Physician (Gastroenterology)   Chief Complaint:  Chief Complaint  Patient presents with   COPD    6 week follow up- patient states he is doing better    HPI: [name] is a 66 y.o. male presenting on 06/05/2022 for COPD (6 week follow up- patient states he is doing better)  COPD: Patient was switched from Anoro to Trelegy six weeks ago due to frequent use of his albuterol inhaler.  He reports today his breathing is much better and most of his wheezing is gone.  He is no longer requiring albuterol daily but does sometimes use it at night.  New complaints: Patient reports cramping in his feet and legs for the past few months.   Social history:  Relevant past medical, surgical, family and social history reviewed and updated as indicated. Interim medical history since our last visit reviewed.  Allergies and medications reviewed and updated.  DATA REVIEWED: CHART IN EPIC  ROS: Negative unless specifically indicated above in HPI.    Current Outpatient Medications:    albuterol (VENTOLIN HFA) 108 (90 Base) MCG/ACT inhaler, Inhale 2 puffs into the lungs every 6 (six) hours as needed for wheezing or shortness of breath., Disp: 18 g, Rfl: 5   aspirin 81 MG EC tablet, Take 1 tablet (81 mg total) by mouth daily. Swallow whole., Disp: 30 tablet, Rfl: 12   atorvastatin (LIPITOR) 10 MG tablet, Take 1 tablet (10 mg total) by mouth daily., Disp: 90 tablet, Rfl: 1   Carboxymethylcellulose Sodium (REFRESH PLUS OP), Place  1 drop into both eyes every 6 (six) hours as needed (dry eyes)., Disp: , Rfl:    Fluticasone-Umeclidin-Vilant (TRELEGY ELLIPTA) 100-62.5-25 MCG/ACT AEPB, Inhale 1 puff into the lungs daily., Disp: 60 each, Rfl: 5   losartan-hydrochlorothiazide (HYZAAR) 50-12.5 MG tablet, Take 1 tablet by mouth daily., Disp: 90 tablet, Rfl: 1   meclizine (ANTIVERT) 25 MG tablet, TAKE 1 TABLET 3 TIMES A DAY AS NEEDED FOR DIZZINESS, Disp: 90 tablet, Rfl: 3   omeprazole (PRILOSEC) 40 MG capsule, TAKE ONE CAPSULE TWICE DAILY, Disp: 60 capsule, Rfl: 5   Allergies  Allergen Reactions   Lisinopril Cough   Oxycodone-Acetaminophen Itching and Nausea Only   Past Medical History:  Diagnosis Date   Aortic atherosclerosis ([place]) 02/19/2021   Cataract    Emphysema lung ([place]) 02/19/2021   Enlarged prostate 01/10/2021   Essential hypertension 01/10/2021   Gastroesophageal reflux disease 01/10/2021   History of kidney stones    Lung nodules    Benign in appearance on low-dose lung cancer screening CT on 02/19/2021   Mixed hyperlipidemia 01/11/2021   Prediabetes 10/18/2021    Past Surgical History:  Procedure Laterality Date   BIOPSY  02/27/2021   Procedure: BIOPSY;  Surgeon: [name], MD;  [facility];  Service: Endoscopy;;  gastric esophageal   COLONOSCOPY N/A 02/27/2021   Two 5 to 6 mm polyps at the ileocecal valve (tubular adenomas), examination was otherwise normal.   ESOPHAGOGASTRODUODENOSCOPY N/A 02/27/2021  Distal esophageal stenosis/stricture.-**Dilated**   ESOPHAGOGASTRODUODENOSCOPY (EGD) WITH PROPOFOL N/A 09/12/2021   Procedure: ESOPHAGOGASTRODUODENOSCOPY (EGD) WITH PROPOFOL;  Surgeon: [name], MD;  [facility];  Service: Endoscopy;  Laterality: N/A;  2:45pm   EYE SURGERY Bilateral    cataract   MALONEY DILATION N/A 02/27/2021   Procedure: MALONEY DILATION;  Surgeon: [name], MD;  [facility];  Service: Endoscopy;  Laterality: N/A;   MALONEY  DILATION N/A 09/12/2021   Procedure: Venia Minks DILATION;  Surgeon: [name], MD;  [facility];  Service: Endoscopy;  Laterality: N/A;   POLYPECTOMY  02/27/2021   Procedure: POLYPECTOMY;  Surgeon: [name], MD;  [facility];  Service: Endoscopy;;  colon    Social History   Socioeconomic History   Marital status: Married    Spouse name: Butch Penny   Number of children: Not on file   Years of education: Not on file   Highest education level: Not on file  Occupational History   Occupation: Retired    Comment: Delivered ice  Tobacco Use   Smoking status: Every Day    Packs/day: 1.00    Types: Cigarettes    Start date: 01/11/1971   Smokeless tobacco: Never  Vaping Use   Vaping Use: Never used  Substance and Sexual Activity   Alcohol use: Yes    Comment: occ   Drug use: Never   Sexual activity: Not on file  Other Topics Concern   Not on file  Social History Narrative   Not on file   Social Determinants of Health   Financial Resource Strain: High Risk (10/29/2021)   Overall Financial Resource Strain (CARDIA)    Difficulty of Paying Living Expenses: Hard  Food Insecurity: Food Insecurity Present (10/29/2021)   Hunger Vital Sign    Worried About [place] in the Last Year: Sometimes true    Ran Out of Food in the Last Year: Sometimes true  Transportation Needs: No Transportation Needs (10/29/2021)   PRAPARE - Hydrologist (Medical): No    Lack of Transportation (Non-Medical): No  Physical Activity: Insufficiently Active (10/09/2021)   Exercise Vital Sign    Days of Exercise per Week: 4 days    Minutes of Exercise per Session: 20 min  Stress: No Stress Concern Present (10/09/2021)   [place]    Feeling of Stress : Only a little  Social Connections: Socially Integrated (10/09/2021)   Social Connection and Isolation Panel [NHANES]    Frequency  of Communication with Friends and Family: More than three times a week    Frequency of Social Gatherings with Friends and Family: More than three times a week    Attends Religious Services: More than 4 times per year    Active Member of [employer] or Organizations: Yes    Attends Music therapist: More than 4 times per year    Marital Status: Married  Human resources officer Violence: Not At Risk (10/09/2021)   Humiliation, Afraid, Rape, and Kick questionnaire    Fear of Current or Ex-Partner: No    Emotionally Abused: No    Physically Abused: No    Sexually Abused: No        Objective:    BP (!) 143/79   Pulse 75   Temp 97.7 F (36.5 C) (Temporal)   Ht '5\' 8"'  (1.727 m)   Wt 179 lb 12.8 oz (81.6  kg)   SpO2 97%   BMI 27.34 kg/m   Wt Readings from Last 3 Encounters:  06/05/22 179 lb 12.8 oz (81.6 kg)  04/24/22 182 lb 3.2 oz (82.6 kg)  12/24/21 179 lb (81.2 kg)    Physical Exam Vitals reviewed.  Constitutional:      General: He is not in acute distress.    Appearance: Normal appearance. He is not ill-appearing, toxic-appearing or diaphoretic.  HENT:     Head: Normocephalic and atraumatic.  Eyes:     General: No scleral icterus.       Right eye: No discharge.        Left eye: No discharge.     Conjunctiva/sclera: Conjunctivae normal.  Cardiovascular:     Rate and Rhythm: Normal rate.  Pulmonary:     Effort: Pulmonary effort is normal. No respiratory distress.  Musculoskeletal:        General: Normal range of motion.     Cervical back: Normal range of motion.  Skin:    General: Skin is warm and dry.  Neurological:     Mental Status: He is alert and oriented to person, place, and time. Mental status is at baseline.  Psychiatric:        Mood and Affect: Mood normal.        Behavior: Behavior normal.        Thought Content: Thought content normal.        Judgment: Judgment normal.     Lab Results  Component Value Date   TSH 1.600 10/15/2021   Lab Results   Component Value Date   WBC 10.8 04/24/2022   HGB 17.6 04/24/2022   HCT 50.0 04/24/2022   MCV 89 04/24/2022   PLT 371 04/24/2022   Lab Results  Component Value Date   NA 142 04/24/2022   K 3.9 04/24/2022   CO2 22 04/24/2022   GLUCOSE 111 (H) 04/24/2022   BUN 14 04/24/2022   CREATININE 1.14 04/24/2022   BILITOT 0.6 04/24/2022   ALKPHOS 151 (H) 04/24/2022   AST 16 04/24/2022   ALT 23 04/24/2022   PROT 7.0 04/24/2022   ALBUMIN 4.5 04/24/2022   CALCIUM 9.7 04/24/2022   EGFR 71 04/24/2022   Lab Results  Component Value Date   CHOL 131 04/24/2022   Lab Results  Component Value Date   HDL 40 04/24/2022   Lab Results  Component Value Date   LDLCALC 65 04/24/2022   Lab Results  Component Value Date   TRIG 151 (H) 04/24/2022   Lab Results  Component Value Date   CHOLHDL 3.3 04/24/2022   Lab Results  Component Value Date   HGBA1C 5.7 (H) 04/24/2022

## 2022-06-06 LAB — TSH: TSH: 1.32 u[IU]/mL (ref 0.450–4.500)

## 2022-06-06 LAB — MAGNESIUM: Magnesium: 2.3 mg/dL (ref 1.6–2.3)

## 2022-06-23 ENCOUNTER — Other Ambulatory Visit: Payer: Self-pay | Admitting: Nurse Practitioner

## 2022-06-23 DIAGNOSIS — R42 Dizziness and giddiness: Secondary | ICD-10-CM

## 2022-06-23 NOTE — Telephone Encounter (Signed)
[facility] patient Last office visit 06/05/22 Last refill 10/30/21, #90, 3 refills

## 2022-08-11 ENCOUNTER — Other Ambulatory Visit: Payer: Self-pay | Admitting: Nurse Practitioner

## 2022-08-11 DIAGNOSIS — R42 Dizziness and giddiness: Secondary | ICD-10-CM

## 2022-09-22 ENCOUNTER — Telehealth: Payer: Self-pay | Admitting: Family Medicine

## 2022-09-22 ENCOUNTER — Other Ambulatory Visit: Payer: Self-pay | Admitting: Nurse Practitioner

## 2022-09-22 ENCOUNTER — Other Ambulatory Visit: Payer: Self-pay | Admitting: Gastroenterology

## 2022-09-22 DIAGNOSIS — I700 Atherosclerosis of aorta: Secondary | ICD-10-CM

## 2022-09-22 DIAGNOSIS — I10 Essential (primary) hypertension: Secondary | ICD-10-CM

## 2022-09-22 DIAGNOSIS — R42 Dizziness and giddiness: Secondary | ICD-10-CM

## 2022-09-22 DIAGNOSIS — E782 Mixed hyperlipidemia: Secondary | ICD-10-CM

## 2022-09-22 NOTE — Telephone Encounter (Signed)
  Prescription Request  09/22/2022  Is this a "Controlled Substance" medicine?   Have you seen your PCP in the last 2 weeks? Appt made for 12/12 and added to wait list   If YES, route message to pool  -  If NO, patient needs to be scheduled for appointment.  What is the name of the medication or equipment? meclizine (ANTIVERT) 25 MG tablet  AND acid reflux medication (could not remember the name and was not home to check the med)   Have you contacted your pharmacy to request a refill? yes   Which pharmacy would you like this sent to?  [facility], [place]       Patient notified that their request is being sent to the clinical staff for review and that they should receive a response within 2 business days.

## 2022-09-23 MED ORDER — LOSARTAN POTASSIUM-HCTZ 50-12.5 MG PO TABS: 1.0000 | ORAL_TABLET | Freq: Every day | ORAL | 0 refills | Status: DC

## 2022-09-23 MED ORDER — ATORVASTATIN CALCIUM 10 MG PO TABS: 10.0000 mg | ORAL_TABLET | Freq: Every day | ORAL | 0 refills | Status: DC

## 2022-09-23 NOTE — Telephone Encounter (Signed)
Let pt know that [place] requested the Meclizine yesterday & it was refilled. The Omeprazole was sent to his GI doctor and it is waiting on approval. Pt also requested his Atorvastatin & Losartan-HCTZ will run out before his 10/14/22 appt Refills sent to pharmacy, keep your appt in December.

## 2022-10-10 ENCOUNTER — Ambulatory Visit (INDEPENDENT_AMBULATORY_CARE_PROVIDER_SITE_OTHER): Payer: Medicare Other

## 2022-10-10 VITALS — Ht 68.0 in | Wt 178.0 lb

## 2022-10-10 DIAGNOSIS — Z0000 Encounter for general adult medical examination without abnormal findings: Secondary | ICD-10-CM

## 2022-10-10 NOTE — Patient Instructions (Signed)
[name] , Thank you for taking time to come for your Medicare Wellness Visit. I appreciate your ongoing commitment to your health goals. Please review the following plan we discussed and let me know if I can assist you in the future.   These are the goals we discussed:  Goals      Exercise 150 min/wk Moderate Activity        This is a list of the screening recommended for you and due dates:  Health Maintenance  Topic Date Due   COVID-19 Vaccine (1) Never done   Zoster (Shingles) Vaccine (1 of 2) Never done   Screening for Lung Cancer  02/19/2022   Medicare Annual Wellness Visit  10/09/2022   Pneumonia Vaccine (1 - PCV) 10/15/2022*   Flu Shot  02/01/2023*   Hepatitis C Screening: USPSTF Recommendation to screen - Ages 18-79 yo.  04/25/2023*   Colon Cancer Screening  02/27/2026   DTaP/Tdap/Td vaccine (2 - Td or Tdap) 02/22/2031   HPV Vaccine  Aged Out  *Topic was postponed. The date shown is not the original due date.    Advanced directives: Advance directive discussed with you today. I have provided a copy for you to complete at home and have notarized. Once this is complete please bring a copy in to our office so we can scan it into your chart.   Conditions/risks identified: Aim for 30 minutes of exercise or brisk walking, 6-8 glasses of water, and 5 servings of fruits and vegetables each day.   Next appointment: Follow up in one year for your annual wellness visit.   Preventive Care 32 Years and Older, Male  Preventive care refers to lifestyle choices and visits with your health care provider that can promote health and wellness. What does preventive care include? A yearly physical exam. This is also called an annual well check. Dental exams once or twice a year. Routine eye exams. Ask your health care provider how often you should have your eyes checked. Personal lifestyle choices, including: Daily care of your teeth and gums. Regular physical activity. Eating a  healthy diet. Avoiding tobacco and drug use. Limiting alcohol use. Practicing safe sex. Taking low doses of aspirin every day. Taking vitamin and mineral supplements as recommended by your health care provider. What happens during an annual well check? The services and screenings done by your health care provider during your annual well check will depend on your age, overall health, lifestyle risk factors, and family history of disease. Counseling  Your health care provider may ask you questions about your: Alcohol use. Tobacco use. Drug use. Emotional well-being. Home and relationship well-being. Sexual activity. Eating habits. History of falls. Memory and ability to understand (cognition). Work and work Statistician. Screening  You may have the following tests or measurements: Height, weight, and BMI. Blood pressure. Lipid and cholesterol levels. These may be checked every 5 years, or more frequently if you are over 26 years old. Skin check. Lung cancer screening. You may have this screening every year starting at age 35 if you have a 30-pack-year history of smoking and currently smoke or have quit within the past 15 years. Fecal occult blood test (FOBT) of the stool. You may have this test every year starting at age 59. Flexible sigmoidoscopy or colonoscopy. You may have a sigmoidoscopy every 5 years or a colonoscopy every 10 years starting at age 89. Prostate cancer screening. Recommendations will vary depending on your family history and other risks. Hepatitis C blood  test. Hepatitis B blood test. Sexually transmitted disease (STD) testing. Diabetes screening. This is done by checking your blood sugar (glucose) after you have not eaten for a while (fasting). You may have this done every 1-3 years. Abdominal aortic aneurysm (AAA) screening. You may need this if you are a current or former smoker. Osteoporosis. You may be screened starting at age 81 if you are at high risk. Talk  with your health care provider about your test results, treatment options, and if necessary, the need for more tests. Vaccines  Your health care provider may recommend certain vaccines, such as: Influenza vaccine. This is recommended every year. Tetanus, diphtheria, and acellular pertussis (Tdap, Td) vaccine. You may need a Td booster every 10 years. Zoster vaccine. You may need this after age 47. Pneumococcal 13-valent conjugate (PCV13) vaccine. One dose is recommended after age 62. Pneumococcal polysaccharide (PPSV23) vaccine. One dose is recommended after age 59. Talk to your health care provider about which screenings and vaccines you need and how often you need them. This information is not intended to replace advice given to you by your health care provider. Make sure you discuss any questions you have with your health care provider. Document Released: 11/16/2015 Document Revised: 07/09/2016 Document Reviewed: 08/21/2015 Elsevier Interactive Patient Education  2017 [place] Prevention in the Home Falls can cause injuries. They can happen to people of all ages. There are many things you can do to make your home safe and to help prevent falls. What can I do on the outside of my home? Regularly fix the edges of walkways and driveways and fix any cracks. Remove anything that might make you trip as you walk through a door, such as a raised step or threshold. Trim any bushes or trees on the path to your home. Use bright outdoor lighting. Clear any walking paths of anything that might make someone trip, such as rocks or tools. Regularly check to see if handrails are loose or broken. Make sure that both sides of any steps have handrails. Any raised decks and porches should have guardrails on the edges. Have any leaves, snow, or ice cleared regularly. Use sand or salt on walking paths during winter. Clean up any spills in your garage right away. This includes oil or grease  spills. What can I do in the bathroom? Use night lights. Install grab bars by the toilet and in the tub and shower. Do not use towel bars as grab bars. Use non-skid mats or decals in the tub or shower. If you need to sit down in the shower, use a plastic, non-slip stool. Keep the floor dry. Clean up any water that spills on the floor as soon as it happens. Remove soap buildup in the tub or shower regularly. Attach bath mats securely with double-sided non-slip rug tape. Do not have throw rugs and other things on the floor that can make you trip. What can I do in the bedroom? Use night lights. Make sure that you have a light by your bed that is easy to reach. Do not use any sheets or blankets that are too big for your bed. They should not hang down onto the floor. Have a firm chair that has side arms. You can use this for support while you get dressed. Do not have throw rugs and other things on the floor that can make you trip. What can I do in the kitchen? Clean up any spills right away. Avoid walking on wet  floors. Keep items that you use a lot in easy-to-reach places. If you need to reach something above you, use a strong step stool that has a grab bar. Keep electrical cords out of the way. Do not use floor polish or wax that makes floors slippery. If you must use wax, use non-skid floor wax. Do not have throw rugs and other things on the floor that can make you trip. What can I do with my stairs? Do not leave any items on the stairs. Make sure that there are handrails on both sides of the stairs and use them. Fix handrails that are broken or loose. Make sure that handrails are as long as the stairways. Check any carpeting to make sure that it is firmly attached to the stairs. Fix any carpet that is loose or worn. Avoid having throw rugs at the top or bottom of the stairs. If you do have throw rugs, attach them to the floor with carpet tape. Make sure that you have a light switch at the  top of the stairs and the bottom of the stairs. If you do not have them, ask someone to add them for you. What else can I do to help prevent falls? Wear shoes that: Do not have high heels. Have rubber bottoms. Are comfortable and fit you well. Are closed at the toe. Do not wear sandals. If you use a stepladder: Make sure that it is fully opened. Do not climb a closed stepladder. Make sure that both sides of the stepladder are locked into place. Ask someone to hold it for you, if possible. Clearly mark and make sure that you can see: Any grab bars or handrails. First and last steps. Where the edge of each step is. Use tools that help you move around (mobility aids) if they are needed. These include: Canes. Walkers. Scooters. Crutches. Turn on the lights when you go into a dark area. Replace any light bulbs as soon as they burn out. Set up your furniture so you have a clear path. Avoid moving your furniture around. If any of your floors are uneven, fix them. If there are any pets around you, be aware of where they are. Review your medicines with your doctor. Some medicines can make you feel dizzy. This can increase your chance of falling. Ask your doctor what other things that you can do to help prevent falls. This information is not intended to replace advice given to you by your health care provider. Make sure you discuss any questions you have with your health care provider. Document Released: 08/16/2009 Document Revised: 03/27/2016 Document Reviewed: 11/24/2014 Elsevier Interactive Patient Education  2017 [employer].

## 2022-10-10 NOTE — Progress Notes (Signed)
Subjective:   Richard Velez is a 66 y.o. male who presents for Medicare Annual/Subsequent preventive examination. I connected with  [name] on 10/10/22 by a audio enabled telemedicine application and verified that I am speaking with the correct person using two identifiers.  Patient Location: Home  Provider Location: Home Office  I discussed the limitations of evaluation and management by telemedicine. The patient expressed understanding and agreed to proceed.  Review of Systems     Cardiac Risk Factors include: advanced age (>24mn, >>71women);hypertension;male gender     Objective:    Today's Vitals   10/10/22 1118  Weight: 178 lb (80.7 kg)  Height: '5\' 8"'$  (1.727 m)   Body mass index is 27.06 kg/m.     10/10/2022   11:21 AM 10/09/2021    2:39 PM 09/12/2021    1:19 PM 09/10/2021    1:45 PM 05/15/2021    3:38 PM 02/27/2021   10:32 AM  Advanced Directives  Does Patient Have a Medical Advance Directive? No No No No No No  Would patient like information on creating a medical advance directive? No - Patient declined No - Patient declined No - Patient declined No - Patient declined No - Patient declined No - Patient declined    Current Medications (verified) Outpatient Encounter Medications as of 10/10/2022  Medication Sig   albuterol (VENTOLIN HFA) 108 (90 Base) MCG/ACT inhaler Inhale 2 puffs into the lungs every 6 (six) hours as needed for wheezing or shortness of breath.   aspirin 81 MG EC tablet Take 1 tablet (81 mg total) by mouth daily. Swallow whole.   atorvastatin (LIPITOR) 10 MG tablet Take 1 tablet (10 mg total) by mouth daily.   Carboxymethylcellulose Sodium (REFRESH PLUS OP) Place 1 drop into both eyes every 6 (six) hours as needed (dry eyes).   Fluticasone-Umeclidin-Vilant (TRELEGY ELLIPTA) 100-62.5-25 MCG/ACT AEPB Inhale 1 puff into the lungs daily.   losartan-hydrochlorothiazide (HYZAAR) 50-12.5 MG tablet Take 1 tablet by mouth daily.   meclizine (ANTIVERT)  25 MG tablet TAKE 1 TABLET 3 TIMES A DAY AS NEEDED FOR DIZZINESS   omeprazole (PRILOSEC) 40 MG capsule TAKE ONE CAPSULE TWICE DAILY   No facility-administered encounter medications on file as of 10/10/2022.    Allergies (verified) Lisinopril and Oxycodone-acetaminophen   History: Past Medical History:  Diagnosis Date   Aortic atherosclerosis ([facility] 02/19/2021   Cataract    Emphysema lung ([facility] 02/19/2021   Enlarged prostate 01/10/2021   Essential hypertension 01/10/2021   Gastroesophageal reflux disease 01/10/2021   History of kidney stones    Lung nodules    Benign in appearance on low-dose lung cancer screening CT on 02/19/2021   Mixed hyperlipidemia 01/11/2021   Prediabetes 10/18/2021   Past Surgical History:  Procedure Laterality Date   BIOPSY  02/27/2021   Procedure: BIOPSY;  Surgeon: RDaneil Dolin MD;  [facility];  Service: Endoscopy;;  gastric esophageal   COLONOSCOPY N/A 02/27/2021   Two 5 to 6 mm polyps at the ileocecal valve (tubular adenomas), examination was otherwise normal.   ESOPHAGOGASTRODUODENOSCOPY N/A 02/27/2021   Distal esophageal stenosis/stricture.-**Dilated**   ESOPHAGOGASTRODUODENOSCOPY (EGD) WITH PROPOFOL N/A 09/12/2021   Procedure: ESOPHAGOGASTRODUODENOSCOPY (EGD) WITH PROPOFOL;  Surgeon: RDaneil Dolin MD;  [facility];  Service: Endoscopy;  Laterality: N/A;  2:45pm   EYE SURGERY Bilateral    cataract   MALONEY DILATION N/A 02/27/2021   Procedure: MALONEY DILATION;  Surgeon: RDaneil Dolin MD;  [facility];  Service: Endoscopy;  Laterality: N/A;   MALONEY DILATION N/A 09/12/2021   Procedure: Venia Minks DILATION;  Surgeon: [name], MD;  [facility];  Service: Endoscopy;  Laterality: N/A;   POLYPECTOMY  02/27/2021   Procedure: POLYPECTOMY;  Surgeon: [name], MD;  [facility];  Service: Endoscopy;;  colon   Family History  Problem Relation Age of Onset   Hypertension  Mother    Diabetes Mother    Heart attack Father    Diabetes Sister    Diabetes Brother    Colon cancer Neg Hx    Gastric cancer Neg Hx    Esophageal cancer Neg Hx    Social History   Socioeconomic History   Marital status: Married    Spouse name: Butch Penny   Number of children: Not on file   Years of education: Not on file   Highest education level: Not on file  Occupational History   Occupation: Retired    Comment: Delivered ice  Tobacco Use   Smoking status: Every Day    Packs/day: 1.00    Types: Cigarettes    Start date: 01/11/1971   Smokeless tobacco: Never  Vaping Use   Vaping Use: Never used  Substance and Sexual Activity   Alcohol use: Yes    Comment: occ   Drug use: Never   Sexual activity: Not on file  Other Topics Concern   Not on file  Social History Narrative   Not on file   Social Determinants of Health   Financial Resource Strain: Low Risk  (10/10/2022)   Overall Financial Resource Strain (CARDIA)    Difficulty of Paying Living Expenses: Not hard at all  Food Insecurity: No Food Insecurity (10/10/2022)   Hunger Vital Sign    Worried About Running Out of Food in the Last Year: Never true    [place] in the Last Year: Never true  Transportation Needs: No Transportation Needs (10/10/2022)   PRAPARE - Hydrologist (Medical): No    Lack of Transportation (Non-Medical): No  Physical Activity: Insufficiently Active (10/10/2022)   Exercise Vital Sign    Days of Exercise per Week: 3 days    Minutes of Exercise per Session: 30 min  Stress: No Stress Concern Present (10/10/2022)   [place]    Feeling of Stress : Not at all  Social Connections: Moderately Isolated (10/10/2022)   Social Connection and Isolation Panel [NHANES]    Frequency of Communication with Friends and Family: More than three times a week    Frequency of Social Gatherings with Friends and  Family: More than three times a week    Attends Religious Services: Never    Marine scientist or Organizations: No    Attends Music therapist: Never    Marital Status: Married    Tobacco Counseling Ready to quit: No Counseling given: Not Answered   Clinical Intake:  Pre-visit preparation completed: Yes  Pain : No/denies pain     Nutritional Risks: None Diabetes: No  How often do you need to have someone help you when you read instructions, pamphlets, or other written materials from your doctor or pharmacy?: 1 - Never  Diabetic?no  Interpreter Needed?: No  Information entered by :: Jadene Pierini, LPN   Activities of Daily Living    10/10/2022   11:22 AM  In your present state of health, do you have any  difficulty performing the following activities:  Hearing? 0  Vision? 0  Difficulty concentrating or making decisions? 0  Walking or climbing stairs? 0  Dressing or bathing? 0  Doing errands, shopping? 0  Preparing Food and eating ? N  Using the Toilet? N  In the past six months, have you accidently leaked urine? N  Do you have problems with loss of bowel control? N  Managing your Medications? N  Managing your Finances? N  Housekeeping or managing your Housekeeping? N    Patient Care Team: Baruch Gouty, FNP as PCP - General (Family Medicine) Gala Romney Cristopher Estimable, MD as Consulting Physician (Gastroenterology)  Indicate any recent Medical Services you may have received from other than [facility] providers in the past year (date may be approximate).     Assessment:   This is a routine wellness examination for Michiel.  Hearing/Vision screen Vision Screening - Comments:: Wears rx glasses - up to date with routine eye exams with  Dr.Lee  Dietary issues and exercise activities discussed: Current Exercise Habits: Home exercise routine, Type of exercise: walking, Time (Minutes): 30, Frequency (Times/Week): 3, Weekly Exercise (Minutes/Week): 90, Intensity:  Mild, Exercise limited by: respiratory conditions(s)   Goals Addressed             This Visit's Progress    Exercise 150 min/wk Moderate Activity   On track      Depression Screen    10/10/2022   11:20 AM 06/05/2022    3:52 PM 04/24/2022    3:56 PM 12/24/2021    2:11 PM 10/15/2021    3:08 PM 10/09/2021    2:40 PM 05/07/2021    2:14 PM  PHQ 2/9 Scores  PHQ - 2 Score 0 0 0 0 0 0 0  PHQ- 9 Score  0 4 4 0  3    Fall Risk    10/10/2022   11:19 AM 06/05/2022    3:52 PM 04/24/2022    3:56 PM 12/24/2021    2:10 PM 10/15/2021    3:08 PM  [place] in the past year? 0 0 0 0 0  Number falls in past yr: 0      Injury with Fall? 0      Risk for fall due to : No Fall Risks      Follow up Falls prevention discussed        FALL RISK PREVENTION PERTAINING TO THE HOME:  Any stairs in or around the home? No  If so, are there any without handrails? No  Home free of loose throw rugs in walkways, pet beds, electrical cords, etc? Yes  Adequate lighting in your home to reduce risk of falls? Yes   ASSISTIVE DEVICES UTILIZED TO PREVENT FALLS:  Life alert? No  Use of a cane, walker or w/c? No  Grab bars in the bathroom? No  Shower chair or bench in shower? No  Elevated toilet seat or a handicapped toilet? No       10/10/2022   11:22 AM 10/09/2021    2:41 PM  6CIT Screen  What Year? 0 points 0 points  What month? 0 points 0 points  What time? 0 points 0 points  Count back from 20 0 points 0 points  Months in reverse 0 points 0 points  Repeat phrase 0 points 6 points  Total Score 0 points 6 points    Immunizations Immunization History  Administered Date(s) Administered   Tdap 02/21/2021    TDAP status:  Up to date  Flu Vaccine status: Due, Education has been provided regarding the importance of this vaccine. Advised may receive this vaccine at local pharmacy or Health Dept. Aware to provide a copy of the vaccination record if obtained from local pharmacy or Health Dept.  Verbalized acceptance and understanding.  Pneumococcal vaccine status: Due, Education has been provided regarding the importance of this vaccine. Advised may receive this vaccine at local pharmacy or Health Dept. Aware to provide a copy of the vaccination record if obtained from local pharmacy or Health Dept. Verbalized acceptance and understanding.  Covid-19 vaccine status: Completed vaccines  Qualifies for Shingles Vaccine? Yes   Zostavax completed No   Shingrix Completed?: No.    Education has been provided regarding the importance of this vaccine. Patient has been advised to call insurance company to determine out of pocket expense if they have not yet received this vaccine. Advised may also receive vaccine at local pharmacy or Health Dept. Verbalized acceptance and understanding.  Screening Tests Health Maintenance  Topic Date Due   COVID-19 Vaccine (1) Never done   Zoster Vaccines- Shingrix (1 of 2) Never done   Lung Cancer Screening  02/19/2022   Medicare Annual Wellness (AWV)  10/09/2022   Pneumonia Vaccine 62+ Years old (1 - PCV) 10/15/2022 (Originally 10/24/1962)   INFLUENZA VACCINE  02/01/2023 (Originally 06/03/2022)   Hepatitis C Screening  04/25/2023 (Originally 10/24/1974)   COLONOSCOPY (Pts 45-72yr Insurance coverage will need to be confirmed)  02/27/2026   DTaP/Tdap/Td (2 - Td or Tdap) 02/22/2031   HPV VACCINES  Aged Out    Health Maintenance  Health Maintenance Due  Topic Date Due   COVID-19 Vaccine (1) Never done   Zoster Vaccines- Shingrix (1 of 2) Never done   Lung Cancer Screening  02/19/2022   Medicare Annual Wellness (AWV)  10/09/2022    Colorectal cancer screening: Type of screening: Colonoscopy. Completed 02/27/2021. Repeat every 5 years  Lung Cancer Screening: (Low Dose CT Chest recommended if Age 313-80years, 30 pack-year currently smoking OR have quit w/in 15years.) does not qualify.   Lung Cancer Screening Referral: n/a  Additional  Screening:  Hepatitis C Screening: does not qualify;  Vision Screening: Recommended annual ophthalmology exams for early detection of glaucoma and other disorders of the eye. Is the patient up to date with their annual eye exam?  Yes  Who is the provider or what is the name of the office in which the patient attends annual eye exams? Dr.Lee  If pt is not established with a provider, would they like to be referred to a provider to establish care? No .   Dental Screening: Recommended annual dental exams for proper oral hygiene  Community Resource Referral / Chronic Care Management: CRR required this visit?  No   CCM required this visit?  No      Plan:     I have personally reviewed and noted the following in the patient's chart:   Medical and social history Use of alcohol, tobacco or illicit drugs  Current medications and supplements including opioid prescriptions. Patient is not currently taking opioid prescriptions. Functional ability and status Nutritional status Physical activity Advanced directives List of other physicians Hospitalizations, surgeries, and ER visits in previous 12 months Vitals Screenings to include cognitive, depression, and falls Referrals and appointments  In addition, I have reviewed and discussed with patient certain preventive protocols, quality metrics, and best practice recommendations. A written personalized care plan for preventive services as well as general  preventive health recommendations were provided to patient.     Daphane Shepherd, LPN   79/07/8000   Nurse Notes: Due Pneumonia vaccine

## 2022-10-14 ENCOUNTER — Encounter: Payer: Self-pay | Admitting: Family Medicine

## 2022-10-14 ENCOUNTER — Ambulatory Visit (INDEPENDENT_AMBULATORY_CARE_PROVIDER_SITE_OTHER): Payer: Medicare Other | Admitting: Family Medicine

## 2022-10-14 VITALS — BP 139/82 | HR 88 | Temp 98.1°F | Ht 68.0 in | Wt 181.0 lb

## 2022-10-14 DIAGNOSIS — R7303 Prediabetes: Secondary | ICD-10-CM

## 2022-10-14 DIAGNOSIS — I700 Atherosclerosis of aorta: Secondary | ICD-10-CM

## 2022-10-14 DIAGNOSIS — E782 Mixed hyperlipidemia: Secondary | ICD-10-CM

## 2022-10-14 DIAGNOSIS — F17210 Nicotine dependence, cigarettes, uncomplicated: Secondary | ICD-10-CM

## 2022-10-14 DIAGNOSIS — I10 Essential (primary) hypertension: Secondary | ICD-10-CM | POA: Diagnosis not present

## 2022-10-14 DIAGNOSIS — Z122 Encounter for screening for malignant neoplasm of respiratory organs: Secondary | ICD-10-CM

## 2022-10-14 DIAGNOSIS — J449 Chronic obstructive pulmonary disease, unspecified: Secondary | ICD-10-CM

## 2022-10-14 MED ORDER — LOSARTAN POTASSIUM-HCTZ 50-12.5 MG PO TABS: 1.0000 | ORAL_TABLET | Freq: Every day | ORAL | 2 refills | Status: DC

## 2022-10-14 MED ORDER — ATORVASTATIN CALCIUM 10 MG PO TABS: 10.0000 mg | ORAL_TABLET | Freq: Every day | ORAL | 2 refills | Status: DC

## 2022-10-14 NOTE — Patient Instructions (Signed)
Magnesium

## 2022-10-14 NOTE — Progress Notes (Signed)
Subjective:  Patient ID: Richard Velez, male    DOB: 03-19-1956, 66 y.o.   MRN: [id number]  Patient Care Team: Baruch Gouty, FNP as PCP - General (Family Medicine) Gala Romney Cristopher Estimable, MD as Consulting Physician (Gastroenterology)   Chief Complaint:  Establish Care [facility] patient ) and Medical Management of Chronic Issues   HPI: Damarkus Velez is a 66 y.o. male presenting on 10/14/2022 for [place] [facility] patient ) and Medical Management of Chronic Issues   1. Prediabetes Does try to limit intake of sugary foods and drinks. Denies polyuria, polyphagia, or polydipsia. No neuropathy reported. No changes in weight.   2. Mixed hyperlipidemia 3. Aortic atherosclerosis (HCC) On ASA and statin therapy and tolerating well. Denies myalgias. Does try to eat healthy. Denies regular exercise or meal planning.   4. Essential hypertension Compliant with medications without associated side effects. Limits salt intake. Denies visual changes, chest pain, leg swelling, headaches, weakness, confusion, or syncope. No palpitations.   5. COPD without exacerbation (HCC) 6. Heavy cigarette smoker (20-39 per day) On Trelegy and tolerating well. Continues to smoke but has cut back significantly. Denies increased symptoms. Has not had to use Albuterol. Due for lung cancer screening.      Relevant past medical, surgical, family, and social history reviewed and updated as indicated.  Allergies and medications reviewed and updated. Data reviewed: Chart in Epic.   Past Medical History:  Diagnosis Date   Aortic atherosclerosis ([place]) 02/19/2021   Cataract    Emphysema lung ([place]) 02/19/2021   Enlarged prostate 01/10/2021   Essential hypertension 01/10/2021   Gastroesophageal reflux disease 01/10/2021   History of kidney stones    Lung nodules    Benign in appearance on low-dose lung cancer screening CT on 02/19/2021   Mixed hyperlipidemia 01/11/2021   Prediabetes 10/18/2021    Past Surgical  History:  Procedure Laterality Date   BIOPSY  02/27/2021   Procedure: BIOPSY;  Surgeon: [name], MD;  [facility];  Service: Endoscopy;;  gastric esophageal   COLONOSCOPY N/A 02/27/2021   Two 5 to 6 mm polyps at the ileocecal valve (tubular adenomas), examination was otherwise normal.   ESOPHAGOGASTRODUODENOSCOPY N/A 02/27/2021   Distal esophageal stenosis/stricture.-**Dilated**   ESOPHAGOGASTRODUODENOSCOPY (EGD) WITH PROPOFOL N/A 09/12/2021   Procedure: ESOPHAGOGASTRODUODENOSCOPY (EGD) WITH PROPOFOL;  Surgeon: [name], MD;  [facility]: [facility];  Service: Endoscopy;  Laterality: N/A;  2:45pm   EYE SURGERY Bilateral    cataract   MALONEY DILATION N/A 02/27/2021   Procedure: MALONEY DILATION;  Surgeon: [name], MD;  [facility]: [facility];  Service: Endoscopy;  Laterality: N/A;   MALONEY DILATION N/A 09/12/2021   Procedure: Venia Minks DILATION;  Surgeon: [name], MD;  [facility]: [facility];  Service: Endoscopy;  Laterality: N/A;   POLYPECTOMY  02/27/2021   Procedure: POLYPECTOMY;  Surgeon: [name], MD;  Location: [facility];  Service: Endoscopy;;  colon    Social History   Socioeconomic History   Marital status: Married    Spouse name: Richard Velez   Number of children: Not on file   Years of education: Not on file   Highest education level: Not on file  Occupational History   Occupation: Retired    Comment: Delivered ice  Tobacco Use   Smoking status: Every Day    Packs/day: 1.00    Types: Cigarettes    Start date: 01/11/1971   Smokeless tobacco: Never  Vaping Use  Vaping Use: Never used  Substance and Sexual Activity   Alcohol use: Yes    Comment: occ   Drug use: Never   Sexual activity: Not on file  Other Topics Concern   Not on file  Social History Narrative   Not on file   Social Determinants of Health   Financial Resource Strain: Low Risk  (10/10/2022)   Overall Financial Resource Strain (CARDIA)     Difficulty of Paying Living Expenses: Not hard at all  Food Insecurity: No Food Insecurity (10/10/2022)   Hunger Vital Sign    Worried About Running Out of Food in the Last Year: Never true    Ran Out of Food in the Last Year: Never true  Transportation Needs: No Transportation Needs (10/10/2022)   PRAPARE - Hydrologist (Medical): No    Lack of Transportation (Non-Medical): No  Physical Activity: Insufficiently Active (10/10/2022)   Exercise Vital Sign    Days of Exercise per Week: 3 days    Minutes of Exercise per Session: 30 min  Stress: No Stress Concern Present (10/10/2022)   [place]    Feeling of Stress : Not at all  Social Connections: Moderately Isolated (10/10/2022)   Social Connection and Isolation Panel [NHANES]    Frequency of Communication with Friends and Family: More than three times a week    Frequency of Social Gatherings with Friends and Family: More than three times a week    Attends Religious Services: Never    Marine scientist or Organizations: No    Attends Archivist Meetings: Never    Marital Status: Married  Human resources officer Violence: Not At Risk (10/10/2022)   Humiliation, Afraid, Rape, and Kick questionnaire    Fear of Current or Ex-Partner: No    Emotionally Abused: No    Physically Abused: No    Sexually Abused: No    Outpatient Encounter Medications as of 10/14/2022  Medication Sig   albuterol (VENTOLIN HFA) 108 (90 Base) MCG/ACT inhaler Inhale 2 puffs into the lungs every 6 (six) hours as needed for wheezing or shortness of breath.   aspirin 81 MG EC tablet Take 1 tablet (81 mg total) by mouth daily. Swallow whole.   Carboxymethylcellulose Sodium (REFRESH PLUS OP) Place 1 drop into both eyes every 6 (six) hours as needed (dry eyes).   Fluticasone-Umeclidin-Vilant (TRELEGY ELLIPTA) 100-62.5-25 MCG/ACT AEPB Inhale 1 puff into the lungs daily.    meclizine (ANTIVERT) 25 MG tablet TAKE 1 TABLET 3 TIMES A DAY AS NEEDED FOR DIZZINESS   omeprazole (PRILOSEC) 40 MG capsule TAKE ONE CAPSULE TWICE DAILY   [DISCONTINUED] atorvastatin (LIPITOR) 10 MG tablet Take 1 tablet (10 mg total) by mouth daily.   [DISCONTINUED] losartan-hydrochlorothiazide (HYZAAR) 50-12.5 MG tablet Take 1 tablet by mouth daily.   atorvastatin (LIPITOR) 10 MG tablet Take 1 tablet (10 mg total) by mouth daily.   losartan-hydrochlorothiazide (HYZAAR) 50-12.5 MG tablet Take 1 tablet by mouth daily.   No facility-administered encounter medications on file as of 10/14/2022.    Allergies  Allergen Reactions   Lisinopril Cough   Oxycodone-Acetaminophen Itching and Nausea Only    Review of Systems  Constitutional:  Negative for activity change, appetite change, chills, diaphoresis, fatigue, fever and unexpected weight change.  HENT: Negative.    Eyes: Negative.  Negative for photophobia and visual disturbance.  Respiratory:  Positive for cough. Negative for apnea, choking, chest tightness, shortness of breath,  wheezing and stridor.   Cardiovascular:  Negative for chest pain, palpitations and leg swelling.  Gastrointestinal:  Negative for abdominal pain, blood in stool, constipation, diarrhea, nausea and vomiting.  Endocrine: Negative.   Genitourinary:  Negative for decreased urine volume, difficulty urinating, dysuria and frequency.  Musculoskeletal:  Negative for arthralgias and myalgias.  Skin: Negative.   Allergic/Immunologic: Negative.   Neurological:  Negative for dizziness, tremors, seizures, syncope, facial asymmetry, speech difficulty, weakness, light-headedness, numbness and headaches.  Hematological: Negative.   Psychiatric/Behavioral:  Negative for confusion, hallucinations, sleep disturbance and suicidal ideas.   All other systems reviewed and are negative.       Objective:  BP 139/82   Pulse 88   Temp 98.1 F (36.7 C) (Temporal)   Ht _0  (1.727 m)    Wt 181 lb (82.1 kg)   SpO2 95%   BMI 27.52 kg/m    Wt Readings from Last 3 Encounters:  10/14/22 181 lb (82.1 kg)  10/10/22 178 lb (80.7 kg)  06/05/22 179 lb 12.8 oz (81.6 kg)    Physical Exam Vitals and nursing note reviewed.  Constitutional:      General: He is not in acute distress.    Appearance: Normal appearance. He is well-developed, well-groomed and overweight. He is not ill-appearing, toxic-appearing or diaphoretic.  HENT:     Head: Normocephalic and atraumatic.     Jaw: There is normal jaw occlusion.     Right Ear: Hearing, tympanic membrane, ear canal and external ear normal.     Left Ear: Hearing, tympanic membrane, ear canal and external ear normal.     Nose: Nose normal.     Mouth/Throat:     Lips: Pink.     Mouth: Mucous membranes are moist.     Pharynx: Oropharynx is clear. Uvula midline.  Eyes:     General: Lids are normal.     Extraocular Movements: Extraocular movements intact.     Conjunctiva/sclera: Conjunctivae normal.     Pupils: Pupils are equal, round, and reactive to light.  Neck:     Thyroid: No thyroid mass, thyromegaly or thyroid tenderness.     Vascular: No carotid bruit or JVD.     Trachea: Trachea and phonation normal.  Cardiovascular:     Rate and Rhythm: Normal rate and regular rhythm.     Chest Wall: PMI is not displaced.     Pulses: Normal pulses.     Heart sounds: Normal heart sounds. No murmur heard.    No friction rub. No gallop.  Pulmonary:     Effort: Pulmonary effort is normal. Prolonged expiration present. No respiratory distress.     Breath sounds: Normal breath sounds. No wheezing.  Abdominal:     General: Bowel sounds are normal. There is no distension or abdominal bruit.     Palpations: Abdomen is soft. There is no hepatomegaly or splenomegaly.     Tenderness: There is no abdominal tenderness. There is no right CVA tenderness or left CVA tenderness.     Hernia: No hernia is present.  Musculoskeletal:        General:  Normal range of motion.     Cervical back: Normal range of motion and neck supple.     Right lower leg: No edema.     Left lower leg: No edema.  Lymphadenopathy:     Cervical: No cervical adenopathy.  Skin:    General: Skin is warm and dry.     Capillary Refill: Capillary refill takes less  than 2 seconds.     Coloration: Skin is not cyanotic, jaundiced or pale.     Findings: No rash.  Neurological:     General: No focal deficit present.     Mental Status: He is alert and oriented to person, place, and time.     Sensory: Sensation is intact.     Motor: Motor function is intact.     Coordination: Coordination is intact.     Gait: Gait is intact.     Deep Tendon Reflexes: Reflexes are normal and symmetric.  Psychiatric:        Attention and Perception: Attention and perception normal.        Mood and Affect: Mood and affect normal.        Speech: Speech normal.        Behavior: Behavior normal. Behavior is cooperative.        Thought Content: Thought content normal.        Cognition and Memory: Cognition and memory normal.        Judgment: Judgment normal.     Results for orders placed or performed in visit on 06/05/22  TSH  Result Value Ref Range   TSH 1.320 0.450 - 4.500 uIU/mL  Magnesium  Result Value Ref Range   Magnesium 2.3 1.6 - 2.3 mg/dL       Pertinent labs & imaging results that were available during my care of the patient were reviewed by me and considered in my medical decision making.  Assessment & Plan:  [place] was seen today for establish care and medical management of chronic issues.  Diagnoses and all orders for this visit:  Prediabetes Labs pending. Diet and exercise encouraged. Will discuss medications if warranted.  -     CBC with Differential/Platelet -     CMP14+EGFR -     Lipid panel -     Thyroid Panel With TSH -     Microalbumin / creatinine urine ratio -     Hemoglobin A1c  Mixed hyperlipidemia Continue ASA and statin therapy. Diet and  exercise encouraged. Will repeat labs today.  -     atorvastatin (LIPITOR) 10 MG tablet; Take 1 tablet (10 mg total) by mouth daily. -     CMP14+EGFR -     Lipid panel  Aortic atherosclerosis (HCC) Continue statin and ASA therapy. Diet and exercise encouraged.  -     atorvastatin (LIPITOR) 10 MG tablet; Take 1 tablet (10 mg total) by mouth daily. -     Lipid panel  Essential hypertension BP well controlled. Changes were not made in regimen today. Goal BP is 130/80. Pt aware to report any persistent high or low readings. DASH diet and exercise encouraged. Exercise at least 150 minutes per week and increase as tolerated. Goal BMI > 25. Stress management encouraged. Avoid nicotine and tobacco product use. Avoid excessive alcohol and NSAID's. Avoid more than 2000 mg of sodium daily. Medications as prescribed. Follow up as scheduled.  -     losartan-hydrochlorothiazide (HYZAAR) 50-12.5 MG tablet; Take 1 tablet by mouth daily. -     CBC with Differential/Platelet -     CMP14+EGFR -     Lipid panel -     Thyroid Panel With TSH -     Microalbumin / creatinine urine ratio  COPD without exacerbation (HCC) Well controlled on current regimen, continue.  Screening for lung cancer Heavy cigarette smoker (20-39 per day) Last screening 02/2021, will order yearly screening CT. Smoking cessation  encouraged.  -     CT CHEST LUNG CA SCREEN LOW DOSE W/O CM; Future     Continue all other maintenance medications.  Follow up plan: Return in about 6 months (around 04/15/2023) for chronic follow up.   Continue healthy lifestyle choices, including diet (rich in fruits, vegetables, and lean proteins, and low in salt and simple carbohydrates) and exercise (at least 30 minutes of moderate physical activity daily).  Educational handout given for health maintenance   The above assessment and management plan was discussed with the patient. The patient verbalized understanding of and has agreed to the management  plan. Patient is aware to call the clinic if they develop any new symptoms or if symptoms persist or worsen. Patient is aware when to return to the clinic for a follow-up visit. Patient educated on when it is appropriate to go to the emergency department.   Monia Pouch, FNP-C [facility] [phone]

## 2022-10-15 LAB — CBC WITH DIFFERENTIAL/PLATELET
Basophils Absolute: 0.2 10*3/uL (ref 0.0–0.2)
Basos: 2 %
EOS (ABSOLUTE): 0.3 10*3/uL (ref 0.0–0.4)
Eos: 3 %
Hematocrit: 50.2 % (ref 37.5–51.0)
Hemoglobin: 17 g/dL (ref 13.0–17.7)
Immature Grans (Abs): 0 10*3/uL (ref 0.0–0.1)
Immature Granulocytes: 0 %
Lymphocytes Absolute: 2.8 10*3/uL (ref 0.7–3.1)
Lymphs: 27 %
MCH: 31.1 pg (ref 26.6–33.0)
MCHC: 33.9 g/dL (ref 31.5–35.7)
MCV: 92 fL (ref 79–97)
Monocytes Absolute: 0.8 10*3/uL (ref 0.1–0.9)
Monocytes: 8 %
Neutrophils Absolute: 6.2 10*3/uL (ref 1.4–7.0)
Neutrophils: 60 %
Platelets: 315 10*3/uL (ref 150–450)
RBC: 5.46 x10E6/uL (ref 4.14–5.80)
RDW: 12.7 % (ref 11.6–15.4)
WBC: 10.3 10*3/uL (ref 3.4–10.8)

## 2022-10-15 LAB — CMP14+EGFR
ALT: 27 IU/L (ref 0–44)
AST: 20 IU/L (ref 0–40)
Albumin/Globulin Ratio: 1.7 (ref 1.2–2.2)
Albumin: 4.2 g/dL (ref 3.9–4.9)
Alkaline Phosphatase: 131 IU/L — ABNORMAL HIGH (ref 44–121)
BUN/Creatinine Ratio: 14 (ref 10–24)
BUN: 17 mg/dL (ref 8–27)
Bilirubin Total: 0.4 mg/dL (ref 0.0–1.2)
CO2: 23 mmol/L (ref 20–29)
Calcium: 9.6 mg/dL (ref 8.6–10.2)
Chloride: 101 mmol/L (ref 96–106)
Creatinine, Ser: 1.24 mg/dL (ref 0.76–1.27)
Globulin, Total: 2.5 g/dL (ref 1.5–4.5)
Glucose: 168 mg/dL — ABNORMAL HIGH (ref 70–99)
Potassium: 4.1 mmol/L (ref 3.5–5.2)
Sodium: 140 mmol/L (ref 134–144)
Total Protein: 6.7 g/dL (ref 6.0–8.5)
eGFR: 64 mL/min/{1.73_m2} (ref >59)

## 2022-10-15 LAB — HEMOGLOBIN A1C
Est. average glucose Bld gHb Est-mCnc: 128 mg/dL
Hgb A1c MFr Bld: 6.1 % — ABNORMAL HIGH (ref 4.8–5.6)

## 2022-10-15 LAB — LIPID PANEL
Chol/HDL Ratio: 3.6 ratio (ref 0.0–5.0)
Cholesterol, Total: 135 mg/dL (ref 100–199)
HDL: 37 mg/dL — ABNORMAL LOW (ref >39)
LDL Chol Calc (NIH): 70 mg/dL (ref 0–99)
Triglycerides: 160 mg/dL — ABNORMAL HIGH (ref 0–149)
VLDL Cholesterol Cal: 28 mg/dL (ref 5–40)

## 2022-10-15 LAB — THYROID PANEL WITH TSH
Free Thyroxine Index: 2.8 (ref 1.2–4.9)
T3 Uptake Ratio: 27 % (ref 24–39)
T4, Total: 10.2 ug/dL (ref 4.5–12.0)
TSH: 2.16 u[IU]/mL (ref 0.450–4.500)

## 2022-10-15 LAB — MICROALBUMIN / CREATININE URINE RATIO
Creatinine, Urine: 104.4 mg/dL
Microalb/Creat Ratio: 8 mg/g creat (ref 0–29)
Microalbumin, Urine: 8.5 ug/mL

## 2022-10-23 ENCOUNTER — Other Ambulatory Visit: Payer: Self-pay | Admitting: Family Medicine

## 2022-10-23 DIAGNOSIS — R42 Dizziness and giddiness: Secondary | ICD-10-CM

## 2022-10-23 DIAGNOSIS — J439 Emphysema, unspecified: Secondary | ICD-10-CM

## 2022-11-04 ENCOUNTER — Other Ambulatory Visit: Payer: Self-pay

## 2022-11-04 DIAGNOSIS — Z87891 Personal history of nicotine dependence: Secondary | ICD-10-CM

## 2022-11-04 DIAGNOSIS — Z122 Encounter for screening for malignant neoplasm of respiratory organs: Secondary | ICD-10-CM

## 2022-11-04 NOTE — Progress Notes (Signed)
Order placed for LDCT per protocol. Patient scheduled for 12/09/21 at 1500. Patient aware.

## 2022-11-20 ENCOUNTER — Other Ambulatory Visit: Payer: Self-pay | Admitting: Family Medicine

## 2022-11-20 DIAGNOSIS — R42 Dizziness and giddiness: Secondary | ICD-10-CM

## 2022-12-04 ENCOUNTER — Encounter: Payer: 59 | Admitting: Family Medicine

## 2022-12-09 ENCOUNTER — Ambulatory Visit (HOSPITAL_COMMUNITY)
Admission: RE | Admit: 2022-12-09 | Discharge: 2022-12-09 | Disposition: A | Discharge status: Home / Self Care | Payer: 59 | Source: Ambulatory Visit | Attending: Physician Assistant | Admitting: Physician Assistant

## 2022-12-09 DIAGNOSIS — Z87891 Personal history of nicotine dependence: Secondary | ICD-10-CM | POA: Diagnosis present

## 2022-12-09 DIAGNOSIS — Z122 Encounter for screening for malignant neoplasm of respiratory organs: Secondary | ICD-10-CM | POA: Insufficient documentation

## 2022-12-10 NOTE — Progress Notes (Signed)
Patient notified of LDCT Lung Cancer Screening Results via mail with the recommendation to follow-up in 12 months. Patient's referring provider has been sent a copy of results. Results are as follows:  IMPRESSION: 1. Lung-RADS 2S, benign appearance or behavior. Continue annual screening with low-dose chest CT without contrast in 12 months. 2. The "S" modifier above refers to potentially clinically significant non lung cancer related findings. Specifically, there is aortic atherosclerosis, in addition to left anterior descending coronary artery disease. Please note that although the presence of coronary artery calcium documents the presence of coronary artery disease, the severity of this disease and any potential stenosis cannot be assessed on this non-gated CT examination. Assessment for potential risk factor modification, dietary therapy or pharmacologic therapy may be warranted, if clinically indicated. 3. Mild diffuse bronchial wall thickening with mild centrilobular and paraseptal emphysema; imaging findings suggestive of underlying COPD.   Aortic Atherosclerosis (ICD10-I70.0) and Emphysema (ICD10-J43.9).

## 2022-12-18 ENCOUNTER — Other Ambulatory Visit: Payer: Self-pay | Admitting: Family Medicine

## 2022-12-18 DIAGNOSIS — J439 Emphysema, unspecified: Secondary | ICD-10-CM

## 2022-12-24 ENCOUNTER — Telehealth: Payer: Self-pay

## 2022-12-24 ENCOUNTER — Ambulatory Visit: Payer: 59 | Admitting: Family Medicine

## 2022-12-24 NOTE — Telephone Encounter (Signed)
Patient aware and verbalizes understanding. 

## 2022-12-24 NOTE — Telephone Encounter (Signed)
Patient spoke to you about having bilateral foot cramps at the last visit.  He was advised to take magnesium. He is taking 269m.  Helps sometimes but he is having to take ibuprofen at night just to sleep due to feet cramps. Should he increase or try something else? Please advise

## 2023-01-19 ENCOUNTER — Other Ambulatory Visit: Payer: Self-pay | Admitting: Family Medicine

## 2023-01-19 DIAGNOSIS — J439 Emphysema, unspecified: Secondary | ICD-10-CM

## 2023-02-03 ENCOUNTER — Other Ambulatory Visit: Payer: Self-pay | Admitting: Family Medicine

## 2023-02-03 DIAGNOSIS — R42 Dizziness and giddiness: Secondary | ICD-10-CM

## 2023-02-17 ENCOUNTER — Other Ambulatory Visit: Payer: Self-pay | Admitting: Family Medicine

## 2023-02-17 DIAGNOSIS — J439 Emphysema, unspecified: Secondary | ICD-10-CM

## 2023-03-17 ENCOUNTER — Other Ambulatory Visit: Payer: Self-pay | Admitting: Family Medicine

## 2023-03-17 ENCOUNTER — Other Ambulatory Visit: Payer: Self-pay | Admitting: Gastroenterology

## 2023-03-17 DIAGNOSIS — R42 Dizziness and giddiness: Secondary | ICD-10-CM

## 2023-03-18 ENCOUNTER — Telehealth: Payer: Self-pay | Admitting: Family Medicine

## 2023-03-24 ENCOUNTER — Encounter: Payer: Self-pay | Admitting: Gastroenterology

## 2023-03-24 ENCOUNTER — Ambulatory Visit (INDEPENDENT_AMBULATORY_CARE_PROVIDER_SITE_OTHER): Payer: 59 | Admitting: Gastroenterology

## 2023-03-24 VITALS — BP 127/79 | HR 69 | Temp 98.2°F | Ht 68.0 in | Wt 175.4 lb

## 2023-03-24 DIAGNOSIS — K219 Gastro-esophageal reflux disease without esophagitis: Secondary | ICD-10-CM | POA: Diagnosis not present

## 2023-03-24 MED ORDER — OMEPRAZOLE 40 MG PO CPDR: 40.0000 mg | DELAYED_RELEASE_CAPSULE | Freq: Two times a day (BID) | ORAL | 3 refills | Status: DC

## 2023-03-24 NOTE — Patient Instructions (Signed)
I have sent refills for omeprazole to your pharmacy. Try to cut this down to once a day, and you can take over-the-counter Pepcid in evening if needed. If you feel you need it twice a day, the prescription was written for that. It's best to take the lowest dose that controls your symptoms.  Please call with any progressive issues with swallowing.  We will see you in 1 year!   I enjoyed seeing you again today! I value our relationship and want to provide genuine, compassionate, and quality care. You may receive a survey regarding your visit with me, and I welcome your feedback! Thanks so much for taking the time to complete this. I look forward to seeing you again.      Gelene Mink, PhD, ANP-BC [facility]

## 2023-03-24 NOTE — Progress Notes (Signed)
Gastroenterology Office Note     Primary Care Physician:  Sonny Masters, FNP  Primary Gastroenterologist: Dr. Jena Gauss    Chief Complaint   Chief Complaint  Patient presents with   Follow-up    Follow up on GERD, and refills     History of Present Illness   Richard Velez is a 67 y.o. male presenting today in follow-up with a history of GERD, dysphagia, adenomas with next colonoscopy in 2027. Last dilation in Nov 2022 of Schatzki ring. Here for routine refills.   Doing well on omeprazole BID. Rarely will have a belch/burn. Nothing significant. He has tried once daily PPI but notes occasional evening symptoms. Hasn't tried Pepcid. No significant solid food dysphagia. No abdominal pain. No overt GI bleeding. Has no other GI concerns today.      Last EGD Nov 2022: moderate Schatzki ring s/p dilation, medium-sized hiatal hernia, normal duodenum.   Colonoscopy April 2022: Two 5 to 6 mm polyps at the ileocecal valve (tubular adenomas), examination was otherwise normal.   Past Medical History:  Diagnosis Date   Aortic atherosclerosis (HCC) 02/19/2021   Cataract    Emphysema lung (HCC) 02/19/2021   Enlarged prostate 01/10/2021   Essential hypertension 01/10/2021   Gastroesophageal reflux disease 01/10/2021   History of kidney stones    Lung nodules    Benign in appearance on low-dose lung cancer screening CT on 02/19/2021   Mixed hyperlipidemia 01/11/2021   Prediabetes 10/18/2021    Past Surgical History:  Procedure Laterality Date   BIOPSY  02/27/2021   Procedure: BIOPSY;  Surgeon: [name], MD;  [facility];  Service: Endoscopy;;  gastric esophageal   COLONOSCOPY N/A 02/27/2021   Two 5 to 6 mm polyps at the ileocecal valve (tubular adenomas), examination was otherwise normal.   ESOPHAGOGASTRODUODENOSCOPY N/A 02/27/2021   Distal esophageal stenosis/stricture.-**Dilated**   ESOPHAGOGASTRODUODENOSCOPY (EGD) WITH PROPOFOL N/A 09/12/2021    Procedure: ESOPHAGOGASTRODUODENOSCOPY (EGD) WITH PROPOFOL;  Surgeon: [name], MD;  [facility];  Service: Endoscopy;  Laterality: N/A;  2:45pm   EYE SURGERY Bilateral    cataract   MALONEY DILATION N/A 02/27/2021   Procedure: MALONEY DILATION;  Surgeon: [name], MD;  [facility];  Service: Endoscopy;  Laterality: N/A;   MALONEY DILATION N/A 09/12/2021   Procedure: Elease Hashimoto DILATION;  Surgeon: [name], MD;  [facility];  Service: Endoscopy;  Laterality: N/A;   POLYPECTOMY  02/27/2021   Procedure: POLYPECTOMY;  Surgeon: [name], MD;  [facility];  Service: Endoscopy;;  colon    Current Outpatient Medications  Medication Sig Dispense Refill   aspirin 81 MG EC tablet Take 1 tablet (81 mg total) by mouth daily. Swallow whole. 30 tablet 12   atorvastatin (LIPITOR) 10 MG tablet Take 1 tablet (10 mg total) by mouth daily. 90 tablet 2   Fluticasone-Umeclidin-Vilant (TRELEGY ELLIPTA) 100-62.5-25 MCG/ACT AEPB INHALE ONE PUFF DAILY 60 each 1   losartan-hydrochlorothiazide (HYZAAR) 50-12.5 MG tablet Take 1 tablet by mouth daily. 90 tablet 2   omeprazole (PRILOSEC) 40 MG capsule Take 1 capsule (40 mg total) by mouth 2 (two) times daily. 180 capsule 3   No current facility-administered medications for this visit.    Allergies as of 03/24/2023 - Review Complete 03/24/2023  Allergen Reaction Noted   Lisinopril Cough 02/21/2021   Oxycodone-acetaminophen Itching and Nausea Only 12/17/2017    Family History  Problem Relation Age of Onset  Hypertension Mother    Diabetes Mother    Heart attack Father    Diabetes Sister    Diabetes Brother    Colon cancer Neg Hx    Gastric cancer Neg Hx    Esophageal cancer Neg Hx     Social History   Socioeconomic History   Marital status: Married    Spouse name: Richard Velez   Number of children: Not on file   Years of education: Not on file   Highest education level: Not on file   Occupational History   Occupation: Retired    Comment: Delivered ice  Tobacco Use   Smoking status: Every Day    Packs/day: 1    Types: Cigarettes    Start date: 01/11/1971   Smokeless tobacco: Never  Vaping Use   Vaping Use: Never used  Substance and Sexual Activity   Alcohol use: Yes    Comment: occ   Drug use: Never   Sexual activity: Not on file  Other Topics Concern   Not on file  Social History Narrative   Not on file   Social Determinants of Health   Financial Resource Strain: Low Risk  (10/10/2022)   Overall Financial Resource Strain (CARDIA)    Difficulty of Paying Living Expenses: Not hard at all  Food Insecurity: No Food Insecurity (10/10/2022)   Hunger Vital Sign    Worried About Running Out of Food in the Last Year: Never true    Ran Out of Food in the Last Year: Never true  Transportation Needs: No Transportation Needs (10/10/2022)   PRAPARE - Administrator, Civil Service (Medical): No    Lack of Transportation (Non-Medical): No  Physical Activity: Insufficiently Active (10/10/2022)   Exercise Vital Sign    Days of Exercise per Week: 3 days    Minutes of Exercise per Session: 30 min  Stress: No Stress Concern Present (10/10/2022)   [employer] of Occupational Health - Occupational Stress Questionnaire    Feeling of Stress : Not at all  Social Connections: Moderately Isolated (10/10/2022)   Social Connection and Isolation Panel [NHANES]    Frequency of Communication with Friends and Family: More than three times a week    Frequency of Social Gatherings with Friends and Family: More than three times a week    Attends Religious Services: Never    Database administrator or Organizations: No    Attends Banker Meetings: Never    Marital Status: Married  Catering manager Violence: Not At Risk (10/10/2022)   Humiliation, Afraid, Rape, and Kick questionnaire    Fear of Current or Ex-Partner: No    Emotionally Abused: No    Physically  Abused: No    Sexually Abused: No     Review of Systems   Gen: Denies any fever, chills, fatigue, weight loss, lack of appetite.  CV: Denies chest pain, heart palpitations, peripheral edema, syncope.  Resp: Denies shortness of breath at rest or with exertion. Denies wheezing or cough.  GI: Denies dysphagia or odynophagia. Denies jaundice, hematemesis, fecal incontinence. GU : Denies urinary burning, urinary frequency, urinary hesitancy MS: Denies joint pain, muscle weakness, cramps, or limitation of movement.  Derm: Denies rash, itching, dry skin Psych: Denies depression, anxiety, memory loss, and confusion Heme: Denies bruising, bleeding, and enlarged lymph nodes.   Physical Exam   BP 127/79   Pulse 69   Temp 98.2 F (36.8 C)   Ht 5\' 8"  (1.727 m)   Wt 175  lb 6.4 oz (79.6 kg)   BMI 26.67 kg/m  General:   Alert and oriented. Pleasant and cooperative. Well-nourished and well-developed.  Head:  Normocephalic and atraumatic. Eyes:  Without icterus Abdomen:  +BS, soft, non-tender and non-distended. No HSM noted. No guarding or rebound. No masses appreciated.  Rectal:  Deferred  Msk:  Symmetrical without gross deformities. Normal posture. Extremities:  Without edema. Neurologic:  Alert and  oriented x4;  grossly normal neurologically. Skin:  Intact without significant lesions or rashes. Psych:  Alert and cooperative. Normal mood and affect.   Assessment   Richard Velez is a 67 y.o. male presenting today in follow-up with a history of GERD, dysphagia, adenomas with next colonoscopy in 2027. Last dilation in Nov 2022 of Schatzki ring. Here for routine refills.   No significant dysphagia. We discussed careful monitoring as would want to pursue EGD/dilation if recurrent.  GERD overall well-controlled if taking omeprazole BID. Discussed lowest dose of effective therapy. Trial once daily and can add Pepcid in evening if needed. Inadvertently skipping an evening dose has led to  evening symptoms; he may not be able to decrease to once daily but will certainly attempt this.      PLAN    Attempt to decrease PPI to once daily. Can add Pepcid in evenings prn Refills provided Colonoscopy 2027 Return in 1 year   Gelene Mink, PhD, [facility]

## 2023-04-01 ENCOUNTER — Ambulatory Visit (INDEPENDENT_AMBULATORY_CARE_PROVIDER_SITE_OTHER): Payer: 59 | Admitting: Family Medicine

## 2023-04-01 ENCOUNTER — Encounter: Payer: Self-pay | Admitting: Family Medicine

## 2023-04-01 VITALS — BP 114/73 | HR 93 | Temp 97.6°F | Ht 68.0 in | Wt 171.4 lb

## 2023-04-01 DIAGNOSIS — L03115 Cellulitis of right lower limb: Secondary | ICD-10-CM

## 2023-04-01 DIAGNOSIS — M62838 Other muscle spasm: Secondary | ICD-10-CM | POA: Diagnosis not present

## 2023-04-01 DIAGNOSIS — E559 Vitamin D deficiency, unspecified: Secondary | ICD-10-CM

## 2023-04-01 DIAGNOSIS — R202 Paresthesia of skin: Secondary | ICD-10-CM | POA: Diagnosis not present

## 2023-04-01 MED ORDER — CEPHALEXIN 500 MG PO CAPS: 500.0000 mg | ORAL_CAPSULE | Freq: Four times a day (QID) | ORAL | 0 refills | Status: AC

## 2023-04-01 NOTE — Progress Notes (Signed)
Subjective:  Patient ID: Richard Velez, male    DOB: Aug 14, 1956, 67 y.o.   MRN: [id number]  Patient Care Team: Sonny Masters, FNP as PCP - General (Family Medicine) Jena Gauss Gerrit Friends, MD as Consulting Physician (Gastroenterology)   Chief Complaint:  vasulitis (03/27/2023/[facility])   HPI: Richard Velez is a 67 y.o. male presenting on 04/01/2023 for vasulitis (03/27/2023/[facility])   Pt presents today for follow up after recent UC visit for lower extremity vasculitis. States he was not started on any medications. The redness is still present and seems to be improved slightly. He does report muscle spasms that are worse at night. He also reports ongoing and worsening pins and needles to his arms and legs. Has been taking magnesium with some relief of symptoms. Denies injury. No other associated symptoms.      Relevant past medical, surgical, family, and social history reviewed and updated as indicated.  Allergies and medications reviewed and updated. Data reviewed: Chart in Epic.   Past Medical History:  Diagnosis Date   Aortic atherosclerosis (HCC) 02/19/2021   Cataract    Emphysema lung (HCC) 02/19/2021   Enlarged prostate 01/10/2021   Essential hypertension 01/10/2021   Gastroesophageal reflux disease 01/10/2021   History of kidney stones    Lung nodules    Benign in appearance on low-dose lung cancer screening CT on 02/19/2021   Mixed hyperlipidemia 01/11/2021   Prediabetes 10/18/2021    Past Surgical History:  Procedure Laterality Date   BIOPSY  02/27/2021   Procedure: BIOPSY;  Surgeon: [name], MD;  [facility];  Service: Endoscopy;;  gastric esophageal   COLONOSCOPY N/A 02/27/2021   Two 5 to 6 mm polyps at the ileocecal valve (tubular adenomas), examination was otherwise normal.   ESOPHAGOGASTRODUODENOSCOPY N/A 02/27/2021   Distal esophageal stenosis/stricture.-**Dilated**    ESOPHAGOGASTRODUODENOSCOPY (EGD) WITH PROPOFOL N/A 09/12/2021   Procedure: ESOPHAGOGASTRODUODENOSCOPY (EGD) WITH PROPOFOL;  Surgeon: [name], MD;  [facility];  Service: Endoscopy;  Laterality: N/A;  2:45pm   EYE SURGERY Bilateral    cataract   MALONEY DILATION N/A 02/27/2021   Procedure: MALONEY DILATION;  Surgeon: [name], MD;  [facility];  Service: Endoscopy;  Laterality: N/A;   MALONEY DILATION N/A 09/12/2021   Procedure: Elease Hashimoto DILATION;  Surgeon: [name], MD;  [facility];  Service: Endoscopy;  Laterality: N/A;   POLYPECTOMY  02/27/2021   Procedure: POLYPECTOMY;  Surgeon: [name], MD;  [facility];  Service: Endoscopy;;  colon    Social History   Socioeconomic History   Marital status: Married    Spouse name: [name]   Number of children: Not on file   Years of education: Not on file   Highest education level: Not on file  Occupational History   Occupation: Retired    Comment: Delivered ice  Tobacco Use   Smoking status: Every Day    Packs/day: 1    Types: Cigarettes    Start date: 01/11/1971   Smokeless tobacco: Never  Vaping Use   Vaping Use: Never used  Substance and Sexual Activity   Alcohol use: Yes    Comment: occ   Drug use: Never   Sexual activity: Not on file  Other Topics Concern   Not on file  Social History Narrative   Not on file   Social Determinants of Health   Financial Resource Strain:  Low Risk  (10/10/2022)   Overall Financial Resource Strain (CARDIA)    Difficulty of Paying Living Expenses: Not hard at all  Food Insecurity: No Food Insecurity (10/10/2022)   Hunger Vital Sign    Worried About Running Out of Food in the Last Year: Never true    Ran Out of Food in the Last Year: Never true  Transportation Needs: No Transportation Needs (10/10/2022)   PRAPARE - Administrator, Civil Service (Medical): No    Lack of Transportation (Non-Medical): No  Physical  Activity: Insufficiently Active (10/10/2022)   Exercise Vital Sign    Days of Exercise per Week: 3 days    Minutes of Exercise per Session: 30 min  Stress: No Stress Concern Present (10/10/2022)   [employer] of Occupational Health - Occupational Stress Questionnaire    Feeling of Stress : Not at all  Social Connections: Moderately Isolated (10/10/2022)   Social Connection and Isolation Panel [NHANES]    Frequency of Communication with Friends and Family: More than three times a week    Frequency of Social Gatherings with Friends and Family: More than three times a week    Attends Religious Services: Never    Database administrator or Organizations: No    Attends Banker Meetings: Never    Marital Status: Married  Catering manager Violence: Not At Risk (10/10/2022)   Humiliation, Afraid, Rape, and Kick questionnaire    Fear of Current or Ex-Partner: No    Emotionally Abused: No    Physically Abused: No    Sexually Abused: No    Outpatient Encounter Medications as of 04/01/2023  Medication Sig   aspirin 81 MG EC tablet Take 1 tablet (81 mg total) by mouth daily. Swallow whole.   atorvastatin (LIPITOR) 10 MG tablet Take 1 tablet (10 mg total) by mouth daily.   cephALEXin (KEFLEX) 500 MG capsule Take 1 capsule (500 mg total) by mouth 4 (four) times daily for 7 days.   Fluticasone-Umeclidin-Vilant (TRELEGY ELLIPTA) 100-62.5-25 MCG/ACT AEPB INHALE ONE PUFF DAILY   losartan-hydrochlorothiazide (HYZAAR) 50-12.5 MG tablet Take 1 tablet by mouth daily.   omeprazole (PRILOSEC) 40 MG capsule Take 1 capsule (40 mg total) by mouth 2 (two) times daily.   No facility-administered encounter medications on file as of 04/01/2023.    Allergies  Allergen Reactions   Lisinopril Cough   Oxycodone-Acetaminophen Itching and Nausea Only    Review of Systems  Constitutional:  Negative for activity change, appetite change, chills, diaphoresis, fatigue, fever and unexpected weight change.   HENT: Negative.    Eyes: Negative.  Negative for photophobia and visual disturbance.  Respiratory:  Negative for cough, chest tightness and shortness of breath.   Cardiovascular:  Negative for chest pain, palpitations and leg swelling.  Gastrointestinal:  Negative for abdominal pain, blood in stool, constipation, diarrhea, nausea and vomiting.  Endocrine: Negative.  Negative for polydipsia, polyphagia and polyuria.  Genitourinary:  Negative for decreased urine volume, difficulty urinating, dysuria, frequency and urgency.  Musculoskeletal:  Positive for myalgias. Negative for arthralgias, back pain, gait problem, joint swelling, neck pain and neck stiffness.  Skin:  Positive for color change and wound.  Allergic/Immunologic: Negative.   Neurological:  Positive for numbness. Negative for dizziness, tremors, seizures, syncope, facial asymmetry, speech difficulty, weakness, light-headedness and headaches.  Hematological: Negative.   Psychiatric/Behavioral:  Negative for confusion, hallucinations, sleep disturbance and suicidal ideas.   All other systems reviewed and are negative.  Objective:  BP 114/73   Pulse 93   Temp 97.6 F (36.4 C)   Ht 5\' 8"  (1.727 m)   Wt 171 lb 6.4 oz (77.7 kg)   SpO2 94%   BMI 26.06 kg/m    Wt Readings from Last 3 Encounters:  04/01/23 171 lb 6.4 oz (77.7 kg)  03/24/23 175 lb 6.4 oz (79.6 kg)  10/14/22 181 lb (82.1 kg)    Physical Exam Vitals and nursing note reviewed.  Constitutional:      General: He is not in acute distress.    Appearance: Normal appearance. He is well-developed, well-groomed and normal weight. He is not ill-appearing, toxic-appearing or diaphoretic.  HENT:     Head: Normocephalic and atraumatic.     Jaw: There is normal jaw occlusion.     Right Ear: Hearing normal.     Left Ear: Hearing normal.     Nose: Nose normal.     Mouth/Throat:     Lips: Pink.     Mouth: Mucous membranes are moist.     Pharynx: Oropharynx is  clear. Uvula midline.  Eyes:     General: Lids are normal.     Extraocular Movements: Extraocular movements intact.     Conjunctiva/sclera: Conjunctivae normal.     Pupils: Pupils are equal, round, and reactive to light.  Neck:     Thyroid: No thyroid mass, thyromegaly or thyroid tenderness.     Vascular: No carotid bruit or JVD.     Trachea: Trachea and phonation normal.  Cardiovascular:     Rate and Rhythm: Normal rate and regular rhythm.     Chest Wall: PMI is not displaced.     Pulses: Normal pulses.     Heart sounds: Normal heart sounds. No murmur heard.    No friction rub. No gallop.  Pulmonary:     Effort: Pulmonary effort is normal. No respiratory distress.     Breath sounds: Normal breath sounds. No wheezing.  Abdominal:     General: Bowel sounds are normal. There is no distension or abdominal bruit.     Palpations: Abdomen is soft. There is no hepatomegaly or splenomegaly.     Tenderness: There is no abdominal tenderness. There is no right CVA tenderness or left CVA tenderness.     Hernia: No hernia is present.  Musculoskeletal:        General: Normal range of motion.     Cervical back: Normal range of motion and neck supple.     Right lower leg: No edema.     Left lower leg: No edema.  Lymphadenopathy:     Cervical: No cervical adenopathy.  Skin:    General: Skin is warm and dry.     Capillary Refill: Capillary refill takes less than 2 seconds.     Coloration: Skin is not cyanotic, jaundiced or pale.     Findings: No rash.  Neurological:     General: No focal deficit present.     Mental Status: He is alert and oriented to person, place, and time.     Sensory: Sensation is intact.     Motor: Motor function is intact.     Coordination: Coordination is intact.     Gait: Gait is intact.     Deep Tendon Reflexes: Reflexes are normal and symmetric.  Psychiatric:        Attention and Perception: Attention and perception normal.        Mood and Affect: Mood and affect  normal.  Speech: Speech normal.        Behavior: Behavior normal. Behavior is cooperative.        Thought Content: Thought content normal.        Cognition and Memory: Cognition and memory normal.        Judgment: Judgment normal.     Results for orders placed or performed in visit on 10/14/22  CBC with Differential/Platelet  Result Value Ref Range   WBC 10.3 3.4 - 10.8 x10E3/uL   RBC 5.46 4.14 - 5.80 x10E6/uL   Hemoglobin 17.0 13.0 - 17.7 g/dL   Hematocrit 16.1 09.6 - 51.0 %   MCV 92 79 - 97 fL   MCH 31.1 26.6 - 33.0 pg   MCHC 33.9 31.5 - 35.7 g/dL   RDW 04.5 40.9 - 81.1 %   Platelets 315 150 - 450 x10E3/uL   Neutrophils 60 Not Estab. %   Lymphs 27 Not Estab. %   Monocytes 8 Not Estab. %   Eos 3 Not Estab. %   Basos 2 Not Estab. %   Neutrophils Absolute 6.2 1.4 - 7.0 x10E3/uL   Lymphocytes Absolute 2.8 0.7 - 3.1 x10E3/uL   Monocytes Absolute 0.8 0.1 - 0.9 x10E3/uL   EOS (ABSOLUTE) 0.3 0.0 - 0.4 x10E3/uL   Basophils Absolute 0.2 0.0 - 0.2 x10E3/uL   Immature Granulocytes 0 Not Estab. %   Immature Grans (Abs) 0.0 0.0 - 0.1 x10E3/uL  CMP14+EGFR  Result Value Ref Range   Glucose 168 (H) 70 - 99 mg/dL   BUN 17 8 - 27 mg/dL   Creatinine, Ser 9.14 0.76 - 1.27 mg/dL   eGFR 64 >78 GN/FAO/1.30   BUN/Creatinine Ratio 14 10 - 24   Sodium 140 134 - 144 mmol/L   Potassium 4.1 3.5 - 5.2 mmol/L   Chloride 101 96 - 106 mmol/L   CO2 23 20 - 29 mmol/L   Calcium 9.6 8.6 - 10.2 mg/dL   Total Protein 6.7 6.0 - 8.5 g/dL   Albumin 4.2 3.9 - 4.9 g/dL   Globulin, Total 2.5 1.5 - 4.5 g/dL   Albumin/Globulin Ratio 1.7 1.2 - 2.2   Bilirubin Total 0.4 0.0 - 1.2 mg/dL   Alkaline Phosphatase 131 (H) 44 - 121 IU/L   AST 20 0 - 40 IU/L   ALT 27 0 - 44 IU/L  Lipid panel  Result Value Ref Range   Cholesterol, Total 135 100 - 199 mg/dL   Triglycerides 865 (H) 0 - 149 mg/dL   HDL 37 (L) >78 mg/dL   VLDL Cholesterol Cal 28 5 - 40 mg/dL   LDL Chol Calc (NIH) 70 0 - 99 mg/dL   Chol/HDL  Ratio 3.6 0.0 - 5.0 ratio  Thyroid Panel With TSH  Result Value Ref Range   TSH 2.160 0.450 - 4.500 uIU/mL   T4, Total 10.2 4.5 - 12.0 ug/dL   T3 Uptake Ratio 27 24 - 39 %   Free Thyroxine Index 2.8 1.2 - 4.9  Microalbumin / creatinine urine ratio  Result Value Ref Range   Creatinine, Urine 104.4 Not Estab. mg/dL   Microalbumin, Urine 8.5 Not Estab. ug/mL   Microalb/Creat Ratio 8 0 - 29 mg/g creat  Hemoglobin A1c  Result Value Ref Range   Hgb A1c MFr Bld 6.1 (H) 4.8 - 5.6 %   Est. average glucose Bld gHb Est-mCnc 128 mg/dL       Pertinent labs & imaging results that were available during my care of the patient were reviewed  by me and considered in my medical decision making.  Assessment & Plan:  Dena was seen today for vasulitis.  Diagnoses and all orders for this visit:  Cellulitis of right lower extremity Has improved slightly but is still present. No systemic symptoms. Will treat with below.  -     Anemia Profile B -     cephALEXin (KEFLEX) 500 MG capsule; Take 1 capsule (500 mg total) by mouth 4 (four) times daily for 7 days.  Paresthesia Muscle spasm Ongoing for several months. Will check for potential deficiencies that could be contributory to symptoms. Report new, worsening, or persistent symptoms. If labs are unremarkable, consider ABI studies.  -     Anemia Profile B -     Magnesium -     CMP14+EGFR -     VITAMIN D 25 Hydroxy (Vit-D Deficiency, Fractures) -     Thyroid Panel With TSH     Continue all other maintenance medications.  Follow up plan: Return if symptoms worsen or fail to improve.   Continue healthy lifestyle choices, including diet (rich in fruits, vegetables, and lean proteins, and low in salt and simple carbohydrates) and exercise (at least 30 minutes of moderate physical activity daily).  Educational handout given for cellulitis    The above assessment and management plan was discussed with the patient. The patient verbalized  understanding of and has agreed to the management plan. Patient is aware to call the clinic if they develop any new symptoms or if symptoms persist or worsen. Patient is aware when to return to the clinic for a follow-up visit. Patient educated on when it is appropriate to go to the emergency department.   Kari Baars, FNP-C [facility] [phone]

## 2023-04-02 ENCOUNTER — Other Ambulatory Visit: Payer: Self-pay

## 2023-04-02 DIAGNOSIS — D72829 Elevated white blood cell count, unspecified: Secondary | ICD-10-CM

## 2023-04-02 LAB — ANEMIA PROFILE B
Basophils Absolute: 0.1 10*3/uL (ref 0.0–0.2)
Basos: 0 %
EOS (ABSOLUTE): 0.1 10*3/uL (ref 0.0–0.4)
Eos: 1 %
Ferritin: 158 ng/mL (ref 30–400)
Folate: 9.5 ng/mL (ref >3.0)
Hematocrit: 50.7 % (ref 37.5–51.0)
Hemoglobin: 17.6 g/dL (ref 13.0–17.7)
Immature Grans (Abs): 0.2 10*3/uL — ABNORMAL HIGH (ref 0.0–0.1)
Immature Granulocytes: 1 %
Iron Saturation: 38 % (ref 15–55)
Iron: 117 ug/dL (ref 38–169)
Lymphocytes Absolute: 3 10*3/uL (ref 0.7–3.1)
Lymphs: 19 %
MCH: 31.4 pg (ref 26.6–33.0)
MCHC: 34.7 g/dL (ref 31.5–35.7)
MCV: 90 fL (ref 79–97)
Monocytes Absolute: 1.4 10*3/uL — ABNORMAL HIGH (ref 0.1–0.9)
Monocytes: 9 %
Neutrophils Absolute: 11.3 10*3/uL — ABNORMAL HIGH (ref 1.4–7.0)
Neutrophils: 70 %
Platelets: 347 10*3/uL (ref 150–450)
RBC: 5.61 x10E6/uL (ref 4.14–5.80)
RDW: 12.8 % (ref 11.6–15.4)
Retic Ct Pct: 1.6 % (ref 0.6–2.6)
Total Iron Binding Capacity: 306 ug/dL (ref 250–450)
UIBC: 189 ug/dL (ref 111–343)
Vitamin B-12: 280 pg/mL (ref 232–1245)
WBC: 16.1 10*3/uL — ABNORMAL HIGH (ref 3.4–10.8)

## 2023-04-02 LAB — CMP14+EGFR
ALT: 18 IU/L (ref 0–44)
AST: 13 IU/L (ref 0–40)
Albumin/Globulin Ratio: 2.4 — ABNORMAL HIGH (ref 1.2–2.2)
Albumin: 4.5 g/dL (ref 3.9–4.9)
Alkaline Phosphatase: 114 IU/L (ref 44–121)
BUN/Creatinine Ratio: 17 (ref 10–24)
BUN: 22 mg/dL (ref 8–27)
Bilirubin Total: 0.8 mg/dL (ref 0.0–1.2)
CO2: 22 mmol/L (ref 20–29)
Calcium: 9.7 mg/dL (ref 8.6–10.2)
Chloride: 98 mmol/L (ref 96–106)
Creatinine, Ser: 1.31 mg/dL — ABNORMAL HIGH (ref 0.76–1.27)
Globulin, Total: 1.9 g/dL (ref 1.5–4.5)
Glucose: 127 mg/dL — ABNORMAL HIGH (ref 70–99)
Potassium: 3.4 mmol/L — ABNORMAL LOW (ref 3.5–5.2)
Sodium: 138 mmol/L (ref 134–144)
Total Protein: 6.4 g/dL (ref 6.0–8.5)
eGFR: 60 mL/min/{1.73_m2} (ref >59)

## 2023-04-02 LAB — THYROID PANEL WITH TSH
Free Thyroxine Index: 3 (ref 1.2–4.9)
T3 Uptake Ratio: 30 % (ref 24–39)
T4, Total: 10 ug/dL (ref 4.5–12.0)
TSH: 1.16 u[IU]/mL (ref 0.450–4.500)

## 2023-04-02 LAB — MAGNESIUM: Magnesium: 2.3 mg/dL (ref 1.6–2.3)

## 2023-04-02 LAB — VITAMIN D 25 HYDROXY (VIT D DEFICIENCY, FRACTURES): Vit D, 25-Hydroxy: 8.1 ng/mL — ABNORMAL LOW (ref 30.0–100.0)

## 2023-04-02 MED ORDER — VITAMIN D (ERGOCALCIFEROL) 1.25 MG (50000 UNIT) PO CAPS: 50000.0000 [IU] | ORAL_CAPSULE | ORAL | 3 refills | Status: DC

## 2023-04-02 NOTE — Addendum Note (Signed)
Addended by: Sonny Masters on: 04/02/2023 08:14 AM   Modules accepted: Orders

## 2023-04-08 ENCOUNTER — Ambulatory Visit: Payer: 59 | Admitting: Family Medicine

## 2023-04-10 ENCOUNTER — Ambulatory Visit: Payer: 59 | Admitting: Family Medicine

## 2023-04-16 ENCOUNTER — Other Ambulatory Visit: Payer: 59

## 2023-04-16 DIAGNOSIS — D72829 Elevated white blood cell count, unspecified: Secondary | ICD-10-CM

## 2023-04-17 LAB — CBC WITH DIFFERENTIAL/PLATELET
Basophils Absolute: 0.1 10*3/uL (ref 0.0–0.2)
Basos: 1 %
EOS (ABSOLUTE): 0.2 10*3/uL (ref 0.0–0.4)
Eos: 2 %
Hematocrit: 47.6 % (ref 37.5–51.0)
Hemoglobin: 16.9 g/dL (ref 13.0–17.7)
Immature Grans (Abs): 0 10*3/uL (ref 0.0–0.1)
Immature Granulocytes: 0 %
Lymphocytes Absolute: 2.8 10*3/uL (ref 0.7–3.1)
Lymphs: 26 %
MCH: 32.4 pg (ref 26.6–33.0)
MCHC: 35.5 g/dL (ref 31.5–35.7)
MCV: 91 fL (ref 79–97)
Monocytes Absolute: 0.6 10*3/uL (ref 0.1–0.9)
Monocytes: 5 %
Neutrophils Absolute: 7.1 10*3/uL — ABNORMAL HIGH (ref 1.4–7.0)
Neutrophils: 66 %
Platelets: 271 10*3/uL (ref 150–450)
RBC: 5.21 x10E6/uL (ref 4.14–5.80)
RDW: 12.8 % (ref 11.6–15.4)
WBC: 10.8 10*3/uL (ref 3.4–10.8)

## 2023-04-20 ENCOUNTER — Other Ambulatory Visit: Payer: Self-pay | Admitting: Family Medicine

## 2023-04-20 DIAGNOSIS — J439 Emphysema, unspecified: Secondary | ICD-10-CM

## 2023-05-05 ENCOUNTER — Ambulatory Visit (INDEPENDENT_AMBULATORY_CARE_PROVIDER_SITE_OTHER): Payer: 59 | Admitting: Family Medicine

## 2023-05-05 ENCOUNTER — Encounter: Payer: Self-pay | Admitting: Family Medicine

## 2023-05-05 VITALS — BP 127/70 | HR 82 | Temp 97.2°F | Ht 68.0 in | Wt 170.8 lb

## 2023-05-05 DIAGNOSIS — E559 Vitamin D deficiency, unspecified: Secondary | ICD-10-CM | POA: Diagnosis not present

## 2023-05-05 DIAGNOSIS — Z1283 Encounter for screening for malignant neoplasm of skin: Secondary | ICD-10-CM

## 2023-05-05 DIAGNOSIS — Z0001 Encounter for general adult medical examination with abnormal findings: Secondary | ICD-10-CM | POA: Diagnosis not present

## 2023-05-05 DIAGNOSIS — Z0000 Encounter for general adult medical examination without abnormal findings: Secondary | ICD-10-CM

## 2023-05-05 DIAGNOSIS — Z125 Encounter for screening for malignant neoplasm of prostate: Secondary | ICD-10-CM

## 2023-05-05 LAB — BAYER DCA HB A1C WAIVED: HB A1C (BAYER DCA - WAIVED): 5.6 % (ref 4.8–5.6)

## 2023-05-05 MED ORDER — VITAMIN D (ERGOCALCIFEROL) 1.25 MG (50000 UNIT) PO CAPS: 50000.0000 [IU] | ORAL_CAPSULE | ORAL | 3 refills | Status: DC

## 2023-05-05 NOTE — Progress Notes (Signed)
Complete physical exam  Patient: Richard Velez   DOB: 1956-07-11   67 y.o. Male  MRN: [id number]  Subjective:    Chief Complaint  Patient presents with   Annual Exam    Richard Velez is a 67 y.o. male who presents today for a complete physical exam. He reports consuming a general diet. The patient does not participate in regular exercise at present. He generally feels well. He reports sleeping well. He does not have additional problems to discuss today.    Most recent fall risk assessment:    05/05/2023    2:17 PM  Fall Risk   Falls in the past year? 0     Most recent depression screenings:    05/05/2023    2:17 PM 04/01/2023    2:16 PM  PHQ 2/9 Scores  PHQ - 2 Score 0 0  PHQ- 9 Score 0 4    Vision:Within last year, Dental: No current dental problems and No regular dental care , and PSA: Agrees to PSA testing  Patient Active Problem List   Diagnosis Date Noted   Prediabetes 10/18/2021   Lip lesion 02/24/2021   COPD without exacerbation (HCC) 02/19/2021   Aortic atherosclerosis (HCC) 02/19/2021   Abnormal CT of the abdomen 01/15/2021   Mixed hyperlipidemia 01/11/2021   Essential hypertension 01/10/2021   Lung nodule 01/10/2021   Tobacco use 01/10/2021   Enlarged prostate 01/10/2021   Esophageal dysphagia 01/10/2021   Gastroesophageal reflux disease without esophagitis 01/10/2021   Dizziness 01/10/2021   Past Medical History:  Diagnosis Date   Aortic atherosclerosis (HCC) 02/19/2021   Cataract    Emphysema lung (HCC) 02/19/2021   Enlarged prostate 01/10/2021   Essential hypertension 01/10/2021   Gastroesophageal reflux disease 01/10/2021   History of kidney stones    Lung nodules    Benign in appearance on low-dose lung cancer screening CT on 02/19/2021   Mixed hyperlipidemia 01/11/2021   Prediabetes 10/18/2021   Past Surgical History:  Procedure Laterality Date   BIOPSY  02/27/2021   Procedure: BIOPSY;  Surgeon: [name], MD;  [facility]: [facility];  Service: Endoscopy;;  gastric esophageal   COLONOSCOPY N/A 02/27/2021   Two 5 to 6 mm polyps at the ileocecal valve (tubular adenomas), examination was otherwise normal.   ESOPHAGOGASTRODUODENOSCOPY N/A 02/27/2021   Distal esophageal stenosis/stricture.-**Dilated**   ESOPHAGOGASTRODUODENOSCOPY (EGD) WITH PROPOFOL N/A 09/12/2021   Procedure: ESOPHAGOGASTRODUODENOSCOPY (EGD) WITH PROPOFOL;  Surgeon: [name], MD;  Location: [facility];  Service: Endoscopy;  Laterality: N/A;  2:45pm   EYE SURGERY Bilateral    cataract   MALONEY DILATION N/A 02/27/2021   Procedure: MALONEY DILATION;  Surgeon: [name], MD;  Location: [facility];  Service: Endoscopy;  Laterality: N/A;   MALONEY DILATION N/A 09/12/2021   Procedure: Elease Hashimoto DILATION;  Surgeon: [name], MD;  Location: [facility];  Service: Endoscopy;  Laterality: N/A;   POLYPECTOMY  02/27/2021   Procedure: POLYPECTOMY;  Surgeon: [name], MD;  Location: [facility];  Service: Endoscopy;;  colon   Social History   Tobacco Use   Smoking status: Every Day    Packs/day: 1    Types: Cigarettes    Start date: 01/11/1971   Smokeless tobacco: Never  Vaping Use   Vaping Use: Never used  Substance Use Topics   Alcohol use: Yes    Comment: occ   Drug use: Never   Social History   Socioeconomic History   Marital  status: Married    Spouse name: [name]   Number of children: Not on file   Years of education: Not on file   Highest education level: Not on file  Occupational History   Occupation: Retired    Comment: Delivered ice  Tobacco Use   Smoking status: Every Day    Packs/day: 1    Types: Cigarettes    Start date: 01/11/1971   Smokeless tobacco: Never  Vaping Use   Vaping Use: Never used  Substance and Sexual Activity   Alcohol use: Yes    Comment: occ   Drug use: Never   Sexual activity: Not on file  Other Topics Concern   Not on file  Social History Narrative   Not on file    Social Determinants of Health   Financial Resource Strain: Low Risk  (10/10/2022)   Overall Financial Resource Strain (CARDIA)    Difficulty of Paying Living Expenses: Not hard at all  Food Insecurity: No Food Insecurity (10/10/2022)   Hunger Vital Sign    Worried About Running Out of Food in the Last Year: Never true    Ran Out of Food in the Last Year: Never true  Transportation Needs: No Transportation Needs (10/10/2022)   PRAPARE - Administrator, Civil Service (Medical): No    Lack of Transportation (Non-Medical): No  Physical Activity: Insufficiently Active (10/10/2022)   Exercise Vital Sign    Days of Exercise per Week: 3 days    Minutes of Exercise per Session: 30 min  Stress: No Stress Concern Present (10/10/2022)   [employer] of Occupational Health - Occupational Stress Questionnaire    Feeling of Stress : Not at all  Social Connections: Moderately Isolated (10/10/2022)   Social Connection and Isolation Panel [NHANES]    Frequency of Communication with Friends and Family: More than three times a week    Frequency of Social Gatherings with Friends and Family: More than three times a week    Attends Religious Services: Never    Database administrator or Organizations: No    Attends Banker Meetings: Never    Marital Status: Married  Catering manager Violence: Not At Risk (10/10/2022)   Humiliation, Afraid, Rape, and Kick questionnaire    Fear of Current or Ex-Partner: No    Emotionally Abused: No    Physically Abused: No    Sexually Abused: No   Family Status  Relation Name Status   Mother  Deceased   Father  Deceased   Sister 3 Alive   Brother 3 Alive   Son  Alive   MGF  Deceased   PGM  Deceased   PGF  Deceased   Other  Deceased   Neg Hx  (Not Specified)   Family History  Problem Relation Age of Onset   Hypertension Mother    Diabetes Mother    Heart attack Father    Diabetes Sister    Diabetes Brother    Colon cancer Neg Hx     Gastric cancer Neg Hx    Esophageal cancer Neg Hx    Allergies  Allergen Reactions   Lisinopril Cough   Oxycodone-Acetaminophen Itching and Nausea Only      Patient Care Team: Sonny Masters, FNP as PCP - General (Family Medicine) Jena Gauss, Gerrit Friends, MD as Consulting Physician (Gastroenterology)   Outpatient Medications Prior to Visit  Medication Sig   aspirin 81 MG EC tablet Take 1 tablet (81 mg total) by mouth daily.  Swallow whole.   atorvastatin (LIPITOR) 10 MG tablet Take 1 tablet (10 mg total) by mouth daily.   Fluticasone-Umeclidin-Vilant (TRELEGY ELLIPTA) 100-62.5-25 MCG/ACT AEPB INHALE ONE PUFF DAILY   losartan-hydrochlorothiazide (HYZAAR) 50-12.5 MG tablet Take 1 tablet by mouth daily.   omeprazole (PRILOSEC) 40 MG capsule Take 1 capsule (40 mg total) by mouth 2 (two) times daily.   [DISCONTINUED] Vitamin D, Ergocalciferol, (DRISDOL) 1.25 MG (50000 UNIT) CAPS capsule Take 1 capsule (50,000 Units total) by mouth every 7 (seven) days.   No facility-administered medications prior to visit.    Review of Systems  Respiratory:  Positive for cough.   Genitourinary:  Negative for dysuria, flank pain, frequency, hematuria and urgency.       Nocturia 1 time per night   Skin:        Skin lesion   All other systems reviewed and are negative.      Objective:     BP 127/70   Pulse 82   Temp (!) 97.2 F (36.2 C) (Temporal)   Ht 5\' 8"  (1.727 m)   Wt 170 lb 12.8 oz (77.5 kg)   SpO2 96%   BMI 25.97 kg/m  BP Readings from Last 3 Encounters:  05/05/23 127/70  04/01/23 114/73  03/24/23 127/79   Wt Readings from Last 3 Encounters:  05/05/23 170 lb 12.8 oz (77.5 kg)  04/01/23 171 lb 6.4 oz (77.7 kg)  03/24/23 175 lb 6.4 oz (79.6 kg)   SpO2 Readings from Last 3 Encounters:  05/05/23 96%  04/01/23 94%  10/14/22 95%      Physical Exam Vitals and nursing note reviewed.  Constitutional:      General: He is not in acute distress.    Appearance: Normal appearance. He  is well-developed, well-groomed and normal weight. He is not ill-appearing, toxic-appearing or diaphoretic.  HENT:     Head: Normocephalic and atraumatic.     Jaw: There is normal jaw occlusion.     Right Ear: Hearing, tympanic membrane, ear canal and external ear normal.     Left Ear: Hearing, tympanic membrane, ear canal and external ear normal.     Nose: Nose normal.     Mouth/Throat:     Lips: Pink.     Mouth: Mucous membranes are moist.     Pharynx: Oropharynx is clear. Uvula midline.  Eyes:     General: Lids are normal.     Extraocular Movements: Extraocular movements intact.     Conjunctiva/sclera: Conjunctivae normal.     Pupils: Pupils are equal, round, and reactive to light.  Neck:     Thyroid: No thyroid mass, thyromegaly or thyroid tenderness.     Vascular: No carotid bruit or JVD.     Trachea: Trachea and phonation normal.  Cardiovascular:     Rate and Rhythm: Normal rate and regular rhythm.     Chest Wall: PMI is not displaced.     Pulses: Normal pulses.     Heart sounds: Normal heart sounds. No murmur heard.    No friction rub. No gallop.  Pulmonary:     Effort: Pulmonary effort is normal. Prolonged expiration present. No respiratory distress.     Breath sounds: Normal breath sounds. No wheezing.  Abdominal:     General: Bowel sounds are normal. There is no distension or abdominal bruit.     Palpations: Abdomen is soft. There is no hepatomegaly or splenomegaly.     Tenderness: There is no abdominal tenderness. There is no right CVA tenderness  or left CVA tenderness.     Hernia: No hernia is present.  Musculoskeletal:        General: Normal range of motion.     Cervical back: Normal range of motion and neck supple.     Right lower leg: No edema.     Left lower leg: No edema.  Lymphadenopathy:     Cervical: No cervical adenopathy.  Skin:    General: Skin is warm and dry.     Capillary Refill: Capillary refill takes less than 2 seconds.     Coloration: Skin is  not cyanotic, jaundiced or pale.     Findings: Lesion present. No rash.       Neurological:     General: No focal deficit present.     Mental Status: He is alert and oriented to person, place, and time.     Sensory: Sensation is intact.     Motor: Motor function is intact.     Coordination: Coordination is intact.     Gait: Gait is intact.     Deep Tendon Reflexes: Reflexes are normal and symmetric.  Psychiatric:        Attention and Perception: Attention and perception normal.        Mood and Affect: Mood and affect normal.        Speech: Speech normal.        Behavior: Behavior normal. Behavior is cooperative.        Thought Content: Thought content normal.        Cognition and Memory: Cognition and memory normal.        Judgment: Judgment normal.      No results found for any visits on 05/05/23. Last CBC Lab Results  Component Value Date   WBC 10.8 04/16/2023   HGB 16.9 04/16/2023   HCT 47.6 04/16/2023   MCV 91 04/16/2023   MCH 32.4 04/16/2023   RDW 12.8 04/16/2023   PLT 271 04/16/2023   Last metabolic panel Lab Results  Component Value Date   GLUCOSE 127 (H) 04/01/2023   NA 138 04/01/2023   K 3.4 (L) 04/01/2023   CL 98 04/01/2023   CO2 22 04/01/2023   BUN 22 04/01/2023   CREATININE 1.31 (H) 04/01/2023   EGFR 60 04/01/2023   CALCIUM 9.7 04/01/2023   PROT 6.4 04/01/2023   ALBUMIN 4.5 04/01/2023   LABGLOB 1.9 04/01/2023   AGRATIO 2.4 (H) 04/01/2023   BILITOT 0.8 04/01/2023   ALKPHOS 114 04/01/2023   AST 13 04/01/2023   ALT 18 04/01/2023   Last lipids Lab Results  Component Value Date   CHOL 135 10/14/2022   HDL 37 (L) 10/14/2022   LDLCALC 70 10/14/2022   TRIG 160 (H) 10/14/2022   CHOLHDL 3.6 10/14/2022   Last hemoglobin A1c Lab Results  Component Value Date   HGBA1C 6.1 (H) 10/14/2022   Last thyroid functions Lab Results  Component Value Date   TSH 1.160 04/01/2023   T4TOTAL 10.0 04/01/2023   Last vitamin D Lab Results  Component Value  Date   VD25OH 8.1 (L) 04/01/2023   Last vitamin B12 and Folate Lab Results  Component Value Date   VITAMINB12 280 04/01/2023   FOLATE 9.5 04/01/2023        Assessment & Plan:    Routine Health Maintenance and Physical Exam  Immunization History  Administered Date(s) Administered   Tdap 02/21/2021    Health Maintenance  Topic Date Due   COVID-19 Vaccine (1) 05/21/2023 (Originally 04/24/1957)  Zoster Vaccines- Shingrix (1 of 2) 08/05/2023 (Originally 10/24/2005)   Pneumonia Vaccine 13+ Years old (1 of 2 - PCV) 05/04/2024 (Originally 10/24/1961)   Hepatitis C Screening  05/04/2024 (Originally 10[birth year]24/1973)   INFLUENZA VACCINE  06/04/2023   Medicare Annual Wellness (AWV)  10/11/2023   Lung Cancer Screening  12/10/2023   Colonoscopy  02/27/2026   DTaP/Tdap/Td (2 - Td or Tdap) 02/22/2031   HPV VACCINES  Aged Out    Discussed health benefits of physical activity, and encouraged him to engage in regular exercise appropriate for his age and condition.  Problem List Items Addressed This Visit   None Visit Diagnoses     Well adult exam    -  Primary   Relevant Orders   BMP8+EGFR   Bayer DCA Hb A1c Waived   Lipid panel   PSA, total and free   Ambulatory referral to Dermatology   Screening for prostate cancer       Relevant Orders   PSA, total and free   Screening for skin cancer       Relevant Orders   Ambulatory referral to Dermatology   Vitamin D deficiency       Relevant Medications   Vitamin D, Ergocalciferol, (DRISDOL) 1.25 MG (50000 UNIT) CAPS capsule      Return in 3 months (on 08/05/2023), or if symptoms worsen or fail to improve, for Vit D.     The above assessment and management plan was discussed with the patient. The patient verbalized understanding of and has agreed to the management plan. Patient is aware to call the clinic if they develop any new symptoms or if symptoms fail to improve or worsen. Patient is aware when to return to the clinic for a  follow-up visit. Patient educated on when it is appropriate to go to the emergency department.   Kari Baars, FNP-C [facility] [address] [place], [place] [address] [phone]

## 2023-05-06 LAB — BMP8+EGFR
BUN/Creatinine Ratio: 18 (ref 10–24)
BUN: 19 mg/dL (ref 8–27)
CO2: 23 mmol/L (ref 20–29)
Calcium: 9.6 mg/dL (ref 8.6–10.2)
Chloride: 102 mmol/L (ref 96–106)
Creatinine, Ser: 1.04 mg/dL (ref 0.76–1.27)
Glucose: 155 mg/dL — ABNORMAL HIGH (ref 70–99)
Potassium: 4 mmol/L (ref 3.5–5.2)
Sodium: 141 mmol/L (ref 134–144)
eGFR: 79 mL/min/{1.73_m2} (ref >59)

## 2023-05-07 LAB — LIPID PANEL W/O CHOL/HDL RATIO
Cholesterol, Total: 124 mg/dL (ref 100–199)
HDL: 36 mg/dL — ABNORMAL LOW (ref >39)
LDL Chol Calc (NIH): 64 mg/dL (ref 0–99)
Triglycerides: 136 mg/dL (ref 0–149)
VLDL Cholesterol Cal: 24 mg/dL (ref 5–40)

## 2023-05-07 LAB — PSA: Prostate Specific Ag, Serum: 1.7 ng/mL (ref 0.0–4.0)

## 2023-05-07 LAB — SPECIMEN STATUS REPORT

## 2023-05-19 ENCOUNTER — Other Ambulatory Visit: Payer: Self-pay | Admitting: Family Medicine

## 2023-05-19 DIAGNOSIS — J439 Emphysema, unspecified: Secondary | ICD-10-CM

## 2023-06-04 ENCOUNTER — Other Ambulatory Visit: Payer: Self-pay | Admitting: *Deleted

## 2023-06-04 DIAGNOSIS — E559 Vitamin D deficiency, unspecified: Secondary | ICD-10-CM

## 2023-06-04 DIAGNOSIS — I700 Atherosclerosis of aorta: Secondary | ICD-10-CM

## 2023-06-04 MED ORDER — ASPIRIN 81 MG PO TBEC: 81.0000 mg | DELAYED_RELEASE_TABLET | Freq: Every day | ORAL | Status: DC

## 2023-06-04 MED ORDER — VITAMIN D (ERGOCALCIFEROL) 1.25 MG (50000 UNIT) PO CAPS: 50000.0000 [IU] | ORAL_CAPSULE | ORAL | 3 refills | Status: DC

## 2023-06-05 ENCOUNTER — Other Ambulatory Visit: Payer: Self-pay | Admitting: *Deleted

## 2023-06-05 DIAGNOSIS — J439 Emphysema, unspecified: Secondary | ICD-10-CM

## 2023-06-05 DIAGNOSIS — I700 Atherosclerosis of aorta: Secondary | ICD-10-CM

## 2023-06-05 DIAGNOSIS — E782 Mixed hyperlipidemia: Secondary | ICD-10-CM

## 2023-06-05 MED ORDER — TRELEGY ELLIPTA 100-62.5-25 MCG/ACT IN AEPB: INHALATION_SPRAY | RESPIRATORY_TRACT | 2 refills | Status: DC

## 2023-06-05 MED ORDER — OMEPRAZOLE 40 MG PO CPDR: 40.0000 mg | DELAYED_RELEASE_CAPSULE | Freq: Two times a day (BID) | ORAL | 2 refills | Status: DC

## 2023-06-05 MED ORDER — ATORVASTATIN CALCIUM 10 MG PO TABS: 10.0000 mg | ORAL_TABLET | Freq: Every day | ORAL | 1 refills | Status: DC

## 2023-06-23 ENCOUNTER — Other Ambulatory Visit: Payer: Self-pay | Admitting: *Deleted

## 2023-06-23 DIAGNOSIS — I700 Atherosclerosis of aorta: Secondary | ICD-10-CM

## 2023-06-23 MED ORDER — ASPIRIN 81 MG PO TBEC: 81.0000 mg | DELAYED_RELEASE_TABLET | Freq: Every day | ORAL | 1 refills | Status: AC

## 2023-08-05 ENCOUNTER — Other Ambulatory Visit: Payer: 59

## 2023-08-19 ENCOUNTER — Telehealth: Payer: Self-pay | Admitting: Family Medicine

## 2023-08-19 ENCOUNTER — Other Ambulatory Visit: Payer: Self-pay | Admitting: Family Medicine

## 2023-08-19 DIAGNOSIS — I10 Essential (primary) hypertension: Secondary | ICD-10-CM

## 2023-08-19 DIAGNOSIS — J439 Emphysema, unspecified: Secondary | ICD-10-CM

## 2023-08-19 MED ORDER — LOSARTAN POTASSIUM-HCTZ 50-12.5 MG PO TABS: 1.0000 | ORAL_TABLET | Freq: Every day | ORAL | 0 refills | Status: DC

## 2023-08-19 NOTE — Telephone Encounter (Signed)
  Prescription Request  08/19/2023  Is this a "Controlled Substance" medicine? losartan-hydrochlorothiazide (HYZAAR) 50-12.5 MG tablet   Have you seen your PCP in the last 2 weeks? no  If YES, route message to pool  -  If NO, patient needs to be scheduled for appointment.  What is the name of the medication or equipment? losartan-hydrochlorothiazide (HYZAAR) 50-12.5 MG tablet   Have you contacted your pharmacy to request a refill? New rx   Which pharmacy would you like this sent to? Mail order   Patient notified that their request is being sent to the clinical staff for review and that they should receive a response within 2 business days.

## 2023-10-13 ENCOUNTER — Other Ambulatory Visit: Payer: Self-pay | Admitting: Family Medicine

## 2023-10-13 DIAGNOSIS — E782 Mixed hyperlipidemia: Secondary | ICD-10-CM

## 2023-10-13 DIAGNOSIS — I700 Atherosclerosis of aorta: Secondary | ICD-10-CM

## 2023-10-14 ENCOUNTER — Ambulatory Visit: Payer: 59

## 2023-10-14 VITALS — Ht 68.0 in | Wt 170.0 lb

## 2023-10-14 DIAGNOSIS — Z0000 Encounter for general adult medical examination without abnormal findings: Secondary | ICD-10-CM | POA: Diagnosis not present

## 2023-10-14 NOTE — Patient Instructions (Signed)
Mr. Kuehnle , Thank you for taking time to come for your Medicare Wellness Visit. I appreciate your ongoing commitment to your health goals. Please review the following plan we discussed and let me know if I can assist you in the future.   Referrals/Orders/Follow-Ups/Clinician Recommendations: Aim for 30 minutes of exercise or brisk walking, 6-8 glasses of water, and 5 servings of fruits and vegetables each day.  This is a list of the screening recommended for you and due dates:  Health Maintenance  Topic Date Due   Zoster (Shingles) Vaccine (1 of 2) Never done   COVID-19 Vaccine (1 - 2023-24 season) Never done   Flu Shot  02/01/2024*   Pneumonia Vaccine (1 of 2 - PCV) 05/04/2024*   Hepatitis C Screening  05/04/2024*   Screening for Lung Cancer  12/10/2023   Medicare Annual Wellness Visit  10/13/2024   Colon Cancer Screening  02/27/2026   DTaP/Tdap/Td vaccine (2 - Td or Tdap) 02/22/2031   HPV Vaccine  Aged Out  *Topic was postponed. The date shown is not the original due date.    Advanced directives: (ACP Link)Information on Advanced Care Planning can be found at [facility] Advance Health Care Directives Advance Health Care Directives ([email or web address]   Next Medicare Annual Wellness Visit scheduled for next year: Yes

## 2023-10-14 NOTE — Progress Notes (Signed)
Subjective:   Richard Velez is a 67 y.o. male who presents for Medicare Annual/Subsequent preventive examination.  Visit Complete: Virtual I connected with  [name] on 10/14/23 by a audio enabled telemedicine application and verified that I am speaking with the correct person using two identifiers.  Patient Location: Home  Provider Location: Home Office  I discussed the limitations of evaluation and management by telemedicine. The patient expressed understanding and agreed to proceed.  Vital Signs: Because this visit was a virtual/telehealth visit, some criteria may be missing or patient reported. Any vitals not documented were not able to be obtained and vitals that have been documented are patient reported.  Cardiac Risk Factors include: advanced age (>48men, >81 women);hypertension;male gender;smoking/ tobacco exposure     Objective:    Today's Vitals   10/14/23 1349  Weight: 170 lb (77.1 kg)  Height: 5\' 8"  (1.727 m)   Body mass index is 25.85 kg/m.     10/14/2023    1:51 PM 10/10/2022   11:21 AM 10/09/2021    2:39 PM 09/12/2021    1:19 PM 09/10/2021    1:45 PM 05/15/2021    3:38 PM 02/27/2021   10:32 AM  Advanced Directives  Does Patient Have a Medical Advance Directive? No No No No No No No  Would patient like information on creating a medical advance directive? Yes (MAU/Ambulatory/Procedural Areas - Information given) No - Patient declined No - Patient declined No - Patient declined No - Patient declined No - Patient declined No - Patient declined    Current Medications (verified) Outpatient Encounter Medications as of 10/14/2023  Medication Sig   aspirin EC 81 MG tablet Take 1 tablet (81 mg total) by mouth daily. Swallow whole.   atorvastatin (LIPITOR) 10 MG tablet TAKE ONE TABLET BY MOUTH DAILY   losartan-hydrochlorothiazide (HYZAAR) 50-12.5 MG tablet Take 1 tablet by mouth daily.   omeprazole (PRILOSEC) 40 MG capsule Take 1 capsule (40 mg total) by mouth 2  (two) times daily.   TRELEGY ELLIPTA 100-62.5-25 MCG/ACT AEPB INHALE ONE PUFF DAILY   Vitamin D, Ergocalciferol, (DRISDOL) 1.25 MG (50000 UNIT) CAPS capsule Take 1 capsule (50,000 Units total) by mouth every 7 (seven) days.   No facility-administered encounter medications on file as of 10/14/2023.    Allergies (verified) Lisinopril and Oxycodone-acetaminophen   History: Past Medical History:  Diagnosis Date   Aortic atherosclerosis (HCC) 02/19/2021   Cataract    Emphysema lung (HCC) 02/19/2021   Enlarged prostate 01/10/2021   Essential hypertension 01/10/2021   Gastroesophageal reflux disease 01/10/2021   History of kidney stones    Lung nodules    Benign in appearance on low-dose lung cancer screening CT on 02/19/2021   Mixed hyperlipidemia 01/11/2021   Prediabetes 10/18/2021   Past Surgical History:  Procedure Laterality Date   BIOPSY  02/27/2021   Procedure: BIOPSY;  Surgeon: [name], MD;  Location: [facility];  Service: Endoscopy;;  gastric esophageal   COLONOSCOPY N/A 02/27/2021   Two 5 to 6 mm polyps at the ileocecal valve (tubular adenomas), examination was otherwise normal.   ESOPHAGOGASTRODUODENOSCOPY N/A 02/27/2021   Distal esophageal stenosis/stricture.-**Dilated**   ESOPHAGOGASTRODUODENOSCOPY (EGD) WITH PROPOFOL N/A 09/12/2021   Procedure: ESOPHAGOGASTRODUODENOSCOPY (EGD) WITH PROPOFOL;  Surgeon: [name], MD;  Location: [facility];  Service: Endoscopy;  Laterality: N/A;  2:45pm   EYE SURGERY Bilateral    cataract   MALONEY DILATION N/A 02/27/2021   Procedure: MALONEY DILATION;  Surgeon: [name], MD;  Location: [facility];  Service: Endoscopy;  Laterality: N/A;   MALONEY DILATION N/A 09/12/2021   Procedure: Elease Hashimoto DILATION;  Surgeon: [name], MD;  Location: [facility];  Service: Endoscopy;  Laterality: N/A;   POLYPECTOMY  02/27/2021   Procedure: POLYPECTOMY;  Surgeon: [name], MD;  Location: [facility];   Service: Endoscopy;;  colon   Family History  Problem Relation Age of Onset   Hypertension Mother    Diabetes Mother    Heart attack Father    Diabetes Sister    Diabetes Brother    Colon cancer Neg Hx    Gastric cancer Neg Hx    Esophageal cancer Neg Hx    Social History   Socioeconomic History   Marital status: Married    Spouse name: [name]   Number of children: Not on file   Years of education: Not on file   Highest education level: Not on file  Occupational History   Occupation: Retired    Comment: Delivered ice  Tobacco Use   Smoking status: Every Day    Current packs/day: 1.00    Average packs/day: 1 pack/day for 52.8 years (52.8 ttl pk-yrs)    Types: Cigarettes    Start date: 01/11/1971   Smokeless tobacco: Never  Vaping Use   Vaping status: Never Used  Substance and Sexual Activity   Alcohol use: Yes    Comment: occ   Drug use: Never   Sexual activity: Not on file  Other Topics Concern   Not on file  Social History Narrative   Not on file   Social Determinants of Health   Financial Resource Strain: Low Risk  (10/14/2023)   Overall Financial Resource Strain (CARDIA)    Difficulty of Paying Living Expenses: Not hard at all  Food Insecurity: No Food Insecurity (10/14/2023)   Hunger Vital Sign    Worried About Running Out of Food in the Last Year: Never true    Ran Out of Food in the Last Year: Never true  Transportation Needs: No Transportation Needs (10/14/2023)   PRAPARE - Administrator, Civil Service (Medical): No    Lack of Transportation (Non-Medical): No  Physical Activity: Insufficiently Active (10/14/2023)   Exercise Vital Sign    Days of Exercise per Week: 3 days    Minutes of Exercise per Session: 30 min  Stress: No Stress Concern Present (10/14/2023)   [employer] of Occupational Health - Occupational Stress Questionnaire    Feeling of Stress : Not at all  Social Connections: Moderately Isolated (10/14/2023)   Social  Connection and Isolation Panel [NHANES]    Frequency of Communication with Friends and Family: More than three times a week    Frequency of Social Gatherings with Friends and Family: Three times a week    Attends Religious Services: Never    Active Member of Clubs or Organizations: No    Attends Engineer, structural: Never    Marital Status: Married    Tobacco Counseling Ready to quit: Not Answered Counseling given: Not Answered   Clinical Intake:  Pre-visit preparation completed: Yes  Pain : No/denies pain     Diabetes: No  How often do you need to have someone help you when you read instructions, pamphlets, or other written materials from your doctor or pharmacy?: 1 - Never  Interpreter Needed?: No  Information entered by :: Kandis Fantasia LPN   Activities of Daily Living    10/14/2023  1:49 PM  In your present state of health, do you have any difficulty performing the following activities:  Hearing? 0  Vision? 0  Difficulty concentrating or making decisions? 0  Walking or climbing stairs? 0  Dressing or bathing? 0  Doing errands, shopping? 0  Preparing Food and eating ? N  Using the Toilet? N  In the past six months, have you accidently leaked urine? N  Do you have problems with loss of bowel control? N  Managing your Medications? N  Managing your Finances? N  Housekeeping or managing your Housekeeping? N    Patient Care Team: Sonny Masters, FNP as PCP - General (Family Medicine) Jena Gauss Gerrit Friends, MD as Consulting Physician (Gastroenterology)  Indicate any recent Medical Services you may have received from other than [facility] providers in the past year (date may be approximate).     Assessment:   This is a routine wellness examination for Richard Velez.  Hearing/Vision screen Hearing Screening - Comments:: Denies hearing difficulties   Vision Screening - Comments:: Wears rx glasses - up to date with routine eye exams with Dr. Conley Rolls     Goals Addressed              This Visit's Progress    Remain active and independent        Depression Screen    10/14/2023    1:50 PM 05/05/2023    2:17 PM 04/01/2023    2:16 PM 10/14/2022   10:56 AM 10/10/2022   11:20 AM 06/05/2022    3:52 PM 04/24/2022    3:56 PM  PHQ 2/9 Scores  PHQ - 2 Score 0 0 0 0 0 0 0  PHQ- 9 Score 0 0 4 2  0 4    Fall Risk    10/14/2023    1:52 PM 05/05/2023    2:17 PM 04/01/2023    2:16 PM 10/14/2022   10:56 AM 10/10/2022   11:19 AM  Fall Risk   Falls in the past year? 0 0 0 0 0  Number falls in past yr: 0    0  Injury with Fall? 0    0  Risk for fall due to : No Fall Risks    No Fall Risks  Follow up Falls prevention discussed;Education provided;Falls evaluation completed    Falls prevention discussed    MEDICARE RISK AT HOME: Medicare Risk at Home Any stairs in or around the home?: No If so, are there any without handrails?: No Home free of loose throw rugs in walkways, pet beds, electrical cords, etc?: Yes Adequate lighting in your home to reduce risk of falls?: Yes Life alert?: No Use of a cane, walker or w/c?: No Grab bars in the bathroom?: Yes Shower chair or bench in shower?: No Elevated toilet seat or a handicapped toilet?: Yes  TIMED UP AND GO:  Was the test performed?  No    Cognitive Function:        10/14/2023    1:52 PM 10/10/2022   11:22 AM 10/09/2021    2:41 PM  6CIT Screen  What Year? 0 points 0 points 0 points  What month? 0 points 0 points 0 points  What time? 0 points 0 points 0 points  Count back from 20 0 points 0 points 0 points  Months in reverse 0 points 0 points 0 points  Repeat phrase 0 points 0 points 6 points  Total Score 0 points 0 points 6 points    Immunizations Immunization  History  Administered Date(s) Administered   Tdap 02/21/2021    TDAP status: Up to date  Flu Vaccine status: Declined, Education has been provided regarding the importance of this vaccine but patient still declined. Advised may receive  this vaccine at local pharmacy or Health Dept. Aware to provide a copy of the vaccination record if obtained from local pharmacy or Health Dept. Verbalized acceptance and understanding.  Pneumococcal vaccine status: Declined,  Education has been provided regarding the importance of this vaccine but patient still declined. Advised may receive this vaccine at local pharmacy or Health Dept. Aware to provide a copy of the vaccination record if obtained from local pharmacy or Health Dept. Verbalized acceptance and understanding.   Covid-19 vaccine status: Declined, Education has been provided regarding the importance of this vaccine but patient still declined. Advised may receive this vaccine at local pharmacy or Health Dept.or vaccine clinic. Aware to provide a copy of the vaccination record if obtained from local pharmacy or Health Dept. Verbalized acceptance and understanding.  Qualifies for Shingles Vaccine? Yes   Zostavax completed No   Shingrix Completed?: No.    Education has been provided regarding the importance of this vaccine. Patient has been advised to call insurance company to determine out of pocket expense if they have not yet received this vaccine. Advised may also receive vaccine at local pharmacy or Health Dept. Verbalized acceptance and understanding.  Screening Tests Health Maintenance  Topic Date Due   Zoster Vaccines- Shingrix (1 of 2) Never done   COVID-19 Vaccine (1 - 2023-24 season) Never done   INFLUENZA VACCINE  02/01/2024 (Originally 06/04/2023)   Pneumonia Vaccine 93+ Years old (1 of 2 - PCV) 05/04/2024 (Originally 10/24/1962)   Hepatitis C Screening  05/04/2024 (Originally 10/24/1974)   Lung Cancer Screening  12/10/2023   Medicare Annual Wellness (AWV)  10/13/2024   Colonoscopy  02/27/2026   DTaP/Tdap/Td (2 - Td or Tdap) 02/22/2031   HPV VACCINES  Aged Out    Health Maintenance  Health Maintenance Due  Topic Date Due   Zoster Vaccines- Shingrix (1 of 2) Never done    COVID-19 Vaccine (1 - 2023-24 season) Never done    Colorectal cancer screening: Type of screening: Colonoscopy. Completed 02/27/21. Repeat every 5 years  Lung Cancer Screening: (Low Dose CT Chest recommended if Age 30-80 years, 20 pack-year currently smoking OR have quit w/in 15years.) does qualify.   Lung Cancer Screening Referral: last 12/09/22  Additional Screening:  Hepatitis C Screening: does qualify  Vision Screening: Recommended annual ophthalmology exams for early detection of glaucoma and other disorders of the eye. Is the patient up to date with their annual eye exam?  Yes  Who is the provider or what is the name of the office in which the patient attends annual eye exams? Dr.Le  If pt is not established with a provider, would they like to be referred to a provider to establish care? No .   Dental Screening: Recommended annual dental exams for proper oral hygiene  Community Resource Referral / Chronic Care Management: CRR required this visit?  No   CCM required this visit?  No     Plan:     I have personally reviewed and noted the following in the patient's chart:   Medical and social history Use of alcohol, tobacco or illicit drugs  Current medications and supplements including opioid prescriptions. Patient is not currently taking opioid prescriptions. Functional ability and status Nutritional status Physical activity Advanced directives List of  other physicians Hospitalizations, surgeries, and ER visits in previous 12 months Vitals Screenings to include cognitive, depression, and falls Referrals and appointments  In addition, I have reviewed and discussed with patient certain preventive protocols, quality metrics, and best practice recommendations. A written personalized care plan for preventive services as well as general preventive health recommendations were provided to patient.     Kandis Fantasia [place], [place]   [id number]/06/6577   After Visit Summary:  (Mail) Due to this being a telephonic visit, the after visit summary with patients personalized plan was offered to patient via mail   Nurse Notes: See routing comments with patient concerns

## 2023-11-05 ENCOUNTER — Encounter: Payer: Self-pay | Admitting: Family Medicine

## 2023-11-05 ENCOUNTER — Ambulatory Visit (INDEPENDENT_AMBULATORY_CARE_PROVIDER_SITE_OTHER): Payer: 59 | Admitting: Family Medicine

## 2023-11-05 VITALS — BP 152/81 | HR 88 | Temp 97.0°F | Ht 68.0 in | Wt 174.0 lb

## 2023-11-05 DIAGNOSIS — I700 Atherosclerosis of aorta: Secondary | ICD-10-CM

## 2023-11-05 DIAGNOSIS — I10 Essential (primary) hypertension: Secondary | ICD-10-CM | POA: Diagnosis not present

## 2023-11-05 DIAGNOSIS — E559 Vitamin D deficiency, unspecified: Secondary | ICD-10-CM | POA: Diagnosis not present

## 2023-11-05 DIAGNOSIS — K219 Gastro-esophageal reflux disease without esophagitis: Secondary | ICD-10-CM

## 2023-11-05 DIAGNOSIS — E782 Mixed hyperlipidemia: Secondary | ICD-10-CM | POA: Diagnosis not present

## 2023-11-05 DIAGNOSIS — J449 Chronic obstructive pulmonary disease, unspecified: Secondary | ICD-10-CM | POA: Diagnosis not present

## 2023-11-05 DIAGNOSIS — R7303 Prediabetes: Secondary | ICD-10-CM

## 2023-11-05 DIAGNOSIS — B369 Superficial mycosis, unspecified: Secondary | ICD-10-CM

## 2023-11-05 DIAGNOSIS — H6243 Otitis externa in other diseases classified elsewhere, bilateral: Secondary | ICD-10-CM

## 2023-11-05 LAB — BAYER DCA HB A1C WAIVED: HB A1C (BAYER DCA - WAIVED): 5.6 % (ref 4.8–5.6)

## 2023-11-05 MED ORDER — LOSARTAN POTASSIUM-HCTZ 50-12.5 MG PO TABS: 1.0000 | ORAL_TABLET | Freq: Every day | ORAL | 1 refills | Status: DC

## 2023-11-05 MED ORDER — ALBUTEROL SULFATE HFA 108 (90 BASE) MCG/ACT IN AERS: 2.0000 | INHALATION_SPRAY | Freq: Four times a day (QID) | RESPIRATORY_TRACT | 11 refills | Status: DC | PRN

## 2023-11-05 MED ORDER — HYDROCORTISONE-ACETIC ACID 1-2 % OT SOLN: 3.0000 [drp] | Freq: Two times a day (BID) | OTIC | 0 refills | Status: AC

## 2023-11-05 NOTE — Progress Notes (Signed)
 Subjective:  Patient ID: Richard Velez, male    DOB: July 27, 1956, 68 y.o.   MRN: [id number]  Patient Care Team: Severa Rock HERO, FNP as PCP - General (Family Medicine) Shaaron Lamar HERO, MD as Consulting Physician (Gastroenterology)   Chief Complaint:  Medical Management of Chronic Issues (6 month chronic follow up. Patient states that the last few months he has been having bilateral ear itching )   HPI: Richard Velez is a 68 y.o. male presenting on 11/05/2023 for Medical Management of Chronic Issues (6 month chronic follow up. Patient states that the last few months he has been having bilateral ear itching )   Discussed the use of AI scribe software for clinical note transcription with the patient, who gave verbal consent to proceed.  History of Present Illness   The patient presents for management of chronic medical conditions. He complains of persistent itching in both ears for the past couple of months. They describe the sensation as intolerable, particularly when trying to sleep. They have attempted to alleviate the symptoms with lotion and a prescribed medication previously given for a lip condition, but these have only provided temporary relief. 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  In addition to the ear discomfort, the patient reports occasional shortness of breath associated with their COPD, particularly when engaging in activities for more than twenty minutes. They are currently managing this with Trelegy and have an expired prescription for Albuterol , which they rarely use.  The patient's ongoing management of hypertension with Losartan  appears to be effective, with no reported issues. They also take Prilosec twice daily for a throat condition and have noticed a significant difference when a dose is missed. They are also on a cholesterol medication, which they believe is working well, particularly  in conjunction with magnesium supplementation.  The patient has a history of borderline A1c levels, indicating prediabetes, and is currently taking Vitamin D  supplements once a week. They deny any increased bone pain or difficulty walking. They also report no increased hunger, thirst, or urination.          Relevant past medical, surgical, family, and social history reviewed and updated as indicated.  Allergies and medications reviewed and updated. Data reviewed: Chart in Epic.   Past Medical History:  Diagnosis Date   Aortic atherosclerosis (HCC) 02/19/2021   Cataract    Emphysema lung (HCC) 02/19/2021   Enlarged prostate 01/10/2021   Essential hypertension 01/10/2021   Gastroesophageal reflux disease 01/10/2021   History of kidney stones    Lung nodules    Benign in appearance on low-dose lung cancer screening CT on 02/19/2021   Mixed hyperlipidemia 01/11/2021   Prediabetes 10/18/2021    Past Surgical History:  Procedure Laterality Date   BIOPSY  02/27/2021   Procedure: BIOPSY;  Surgeon: Shaaron Lamar HERO, MD;  Location: [facility];  Service: Endoscopy;;  gastric esophageal   COLONOSCOPY N/A 02/27/2021   Two 5 to 6 mm polyps at the ileocecal valve (tubular adenomas), examination was otherwise normal.   ESOPHAGOGASTRODUODENOSCOPY N/A 02/27/2021   Distal esophageal stenosis/stricture.-**Dilated**   ESOPHAGOGASTRODUODENOSCOPY (EGD) WITH PROPOFOL  N/A 09/12/2021   Procedure: ESOPHAGOGASTRODUODENOSCOPY (EGD) WITH PROPOFOL ;  Surgeon: Shaaron Lamar HERO, MD;  Location: [facility];  Service: Endoscopy;  Laterality: N/A;  2:45pm   EYE SURGERY Bilateral    cataract   MALONEY DILATION N/A 02/27/2021   Procedure: AGAPITO DILATION;  Surgeon: Shaaron Lamar HERO,  MD;  Location: [facility];  Service: Endoscopy;  Laterality: N/A;   MALONEY DILATION N/A 09/12/2021   Procedure: AGAPITO DILATION;  Surgeon: Shaaron Lamar HERO, MD;  Location: [facility];  Service: Endoscopy;  Laterality: N/A;    POLYPECTOMY  02/27/2021   Procedure: POLYPECTOMY;  Surgeon: Shaaron Lamar HERO, MD;  Location: [facility];  Service: Endoscopy;;  colon    Social History   Socioeconomic History   Marital status: Married    Spouse name: Arland   Number of children: Not on file   Years of education: Not on file   Highest education level: Not on file  Occupational History   Occupation: Retired    Comment: Delivered ice  Tobacco Use   Smoking status: Every Day    Current packs/day: 1.00    Average packs/day: 1 pack/day for 52.8 years (52.8 ttl pk-yrs)    Types: Cigarettes    Start date: 01/11/1971   Smokeless tobacco: Never  Vaping Use   Vaping status: Never Used  Substance and Sexual Activity   Alcohol use: Yes    Comment: occ   Drug use: Never   Sexual activity: Not on file  Other Topics Concern   Not on file  Social History Narrative   Not on file   Social Drivers of Health   Financial Resource Strain: Low Risk  (10/14/2023)   Overall Financial Resource Strain (CARDIA)    Difficulty of Paying Living Expenses: Not hard at all  Food Insecurity: No Food Insecurity (10/14/2023)   Hunger Vital Sign    Worried About Running Out of Food in the Last Year: Never true    Ran Out of Food in the Last Year: Never true  Transportation Needs: No Transportation Needs (10/14/2023)   PRAPARE - Administrator, Civil Service (Medical): No    Lack of Transportation (Non-Medical): No  Physical Activity: Insufficiently Active (10/14/2023)   Exercise Vital Sign    Days of Exercise per Week: 3 days    Minutes of Exercise per Session: 30 min  Stress: No Stress Concern Present (10/14/2023)   [employer] of Occupational Health - Occupational Stress Questionnaire    Feeling of Stress : Not at all  Social Connections: Moderately Isolated (10/14/2023)   Social Connection and Isolation Panel [NHANES]    Frequency of Communication with Friends and Family: More than three times a week     Frequency of Social Gatherings with Friends and Family: Three times a week    Attends Religious Services: Never    Active Member of Clubs or Organizations: No    Attends Banker Meetings: Never    Marital Status: Married  Catering Manager Violence: Not At Risk (10/14/2023)   Humiliation, Afraid, Rape, and Kick questionnaire    Fear of Current or Ex-Partner: No    Emotionally Abused: No    Physically Abused: No    Sexually Abused: No    Outpatient Encounter Medications as of 11/05/2023  Medication Sig   acetic acid -hydrocortisone  (VOSOL -HC) OTIC solution Place 3 drops into both ears 2 (two) times daily for 7 days.   albuterol  (VENTOLIN  HFA) 108 (90 Base) MCG/ACT inhaler Inhale 2 puffs into the lungs every 6 (six) hours as needed for wheezing or shortness of breath.   aspirin  EC 81 MG tablet Take 1 tablet (81 mg total) by mouth daily. Swallow whole.   atorvastatin  (LIPITOR) 10 MG tablet TAKE ONE TABLET BY MOUTH DAILY   omeprazole  (PRILOSEC) 40 MG  capsule Take 1 capsule (40 mg total) by mouth 2 (two) times daily.   TRELEGY ELLIPTA  100-62.5-25 MCG/ACT AEPB INHALE ONE PUFF DAILY   Vitamin D , Ergocalciferol , (DRISDOL ) 1.25 MG (50000 UNIT) CAPS capsule Take 1 capsule (50,000 Units total) by mouth every 7 (seven) days.   [DISCONTINUED] losartan -hydrochlorothiazide  (HYZAAR) 50-12.5 MG tablet Take 1 tablet by mouth daily.   losartan -hydrochlorothiazide  (HYZAAR) 50-12.5 MG tablet Take 1 tablet by mouth daily.   No facility-administered encounter medications on file as of 11/05/2023.    Allergies  Allergen Reactions   Lisinopril  Cough   Oxycodone-Acetaminophen Itching and Nausea Only    Pertinent ROS per HPI, otherwise unremarkable      Objective:  BP (!) 152/81   Pulse 88   Temp (!) 97 F (36.1 C)   Ht 5' 8 (1.727 m)   Wt 174 lb (78.9 kg)   SpO2 96%   BMI 26.46 kg/m    Wt Readings from Last 3 Encounters:  11/05/23 174 lb (78.9 kg)  10/14/23 170 lb (77.1 kg)   05/05/23 170 lb 12.8 oz (77.5 kg)    Physical Exam Vitals and nursing note reviewed.  Constitutional:      Appearance: Normal appearance. He is overweight.  HENT:     Head: Normocephalic and atraumatic.     Right Ear: Tenderness present.     Left Ear: Tenderness present.     Ears:     Comments: White patches to ear canals    Nose: Nose normal.     Mouth/Throat:     Mouth: Mucous membranes are moist.     Pharynx: Oropharynx is clear.  Eyes:     Conjunctiva/sclera: Conjunctivae normal.     Pupils: Pupils are equal, round, and reactive to light.  Cardiovascular:     Rate and Rhythm: Normal rate and regular rhythm.     Heart sounds: Normal heart sounds.  Pulmonary:     Effort: Pulmonary effort is normal.     Breath sounds: Normal breath sounds.  Musculoskeletal:     Cervical back: Neck supple.     Right lower leg: No edema.     Left lower leg: No edema.  Skin:    General: Skin is warm and dry.     Capillary Refill: Capillary refill takes less than 2 seconds.  Neurological:     General: No focal deficit present.     Mental Status: He is alert and oriented to person, place, and time.  Psychiatric:        Mood and Affect: Mood normal.        Behavior: Behavior normal.        Thought Content: Thought content normal.        Judgment: Judgment normal.    Physical Exam   HEENT: Dry skin and itching around the ear with white patches and flakiness indicative of a fungal infection.        Results for orders placed or performed in visit on 05/05/23  Bayer DCA Hb A1c Waived   Collection Time: 05/05/23  2:18 PM  Result Value Ref Range   HB A1C (BAYER DCA - WAIVED) 5.6 4.8 - 5.6 %  BMP8+EGFR   Collection Time: 05/05/23  2:19 PM  Result Value Ref Range   Glucose 155 (H) 70 - 99 mg/dL   BUN 19 8 - 27 mg/dL   Creatinine, Ser 8.95 0.76 - 1.27 mg/dL   eGFR 79 >40 fO/fpw/8.26   BUN/Creatinine Ratio 18 10 - 24  Sodium 141 134 - 144 mmol/L   Potassium 4.0 3.5 - 5.2 mmol/L    Chloride 102 96 - 106 mmol/L   CO2 23 20 - 29 mmol/L   Calcium  9.6 8.6 - 10.2 mg/dL  Lipid Panel w/o Chol/HDL Ratio   Collection Time: 05/05/23  2:19 PM  Result Value Ref Range   Cholesterol, Total 124 100 - 199 mg/dL   Triglycerides 863 0 - 149 mg/dL   HDL 36 (L) >60 mg/dL   VLDL Cholesterol Cal 24 5 - 40 mg/dL   LDL Chol Calc (NIH) 64 0 - 99 mg/dL  PSA   Collection Time: 05/05/23  2:19 PM  Result Value Ref Range   Prostate Specific Ag, Serum 1.7 0.0 - 4.0 ng/mL  Specimen status report   Collection Time: 05/05/23  2:19 PM  Result Value Ref Range   specimen status report Comment        Pertinent labs & imaging results that were available during my care of the patient were reviewed by me and considered in my medical decision making.  Assessment & Plan:  Dmarion was seen today for medical management of chronic issues.  Diagnoses and all orders for this visit:  Essential hypertension -     losartan -hydrochlorothiazide  (HYZAAR) 50-12.5 MG tablet; Take 1 tablet by mouth daily. -     CBC with Differential/Platelet -     CMP14+EGFR -     Thyroid  Panel With TSH  Mixed hyperlipidemia -     CMP14+EGFR  Vitamin D  deficiency -     CMP14+EGFR -     VITAMIN D  25 Hydroxy (Vit-D Deficiency, Fractures)  Prediabetes -     Bayer DCA Hb A1c Waived -     CMP14+EGFR  COPD without exacerbation (HCC) -     albuterol  (VENTOLIN  HFA) 108 (90 Base) MCG/ACT inhaler; Inhale 2 puffs into the lungs every 6 (six) hours as needed for wheezing or shortness of breath.  Gastroesophageal reflux disease without esophagitis -     CBC with Differential/Platelet  Aortic atherosclerosis (HCC) Continue ASA and statin  Otitis externa, fungal, both ears -     acetic acid -hydrocortisone  (VOSOL -HC) OTIC solution; Place 3 drops into both ears 2 (two) times daily for 7 days.     Assessment and Plan    Chronic Obstructive Pulmonary Disease (COPD) Reports mild dyspnea after 20 minutes of activity. Continues  Trelegy. Albuterol  inhaler expired and infrequently used. Discussed importance of having albuterol  to prevent exacerbations. - Refill albuterol  inhaler  Fungal Otitis Externa Reports ear pruritus for a couple of months. Noted white patches and flakiness consistent with fungal infection. Discussed treatment options and insurance coverage for antifungal ear drops. - Prescribe antifungal ear drops - Refer to ENT if no improvement in a few days  Hypertension Blood pressure well-controlled. No new symptoms such as chest pain, edema, or significant dyspnea. Manual blood pressure readings consistent. - Refill losartan   Hyperlipidemia No myalgia with current statin. Last cholesterol check in July was within target range. Magnesium supplementation appears beneficial for muscle pain. - Recheck cholesterol at next visit  Pre-Diabetes No polyphagia, polydipsia, or polyuria. Previous A1c in prediabetes range. Will recheck A1c to monitor status. - Recheck A1c  Gastroesophageal Reflux Disease (GERD) Continues Prilosec BID. Noted that missing a dose results in symptoms. Discussed importance of adherence. - Continue current Prilosec regimen  Vitamin D  Deficiency Continues weekly vitamin D . No increased bone pain or gait disturbances. - Recheck vitamin D  levels  General Health Maintenance Routine  health maintenance discussed. Labs ordered for A1c, vitamin D , and thyroid  function. - Order labs for A1c, vitamin D , and thyroid  function - Follow up in 3-4 months.          Continue all other maintenance medications.  Follow up plan: Return if symptoms worsen or fail to improve.   Continue healthy lifestyle choices, including diet (rich in fruits, vegetables, and lean proteins, and low in salt and simple carbohydrates) and exercise (at least 30 minutes of moderate physical activity daily).  Educational handout given for prediabetes   The above assessment and management plan was discussed with  the patient. The patient verbalized understanding of and has agreed to the management plan. Patient is aware to call the clinic if they develop any new symptoms or if symptoms persist or worsen. Patient is aware when to return to the clinic for a follow-up visit. Patient educated on when it is appropriate to go to the emergency department.   Rosaline Bruns, FNP-C [facility] [phone]

## 2023-11-05 NOTE — Progress Notes (Signed)
 Attempted to reach patient regarding follow-up LDCT. Unable to reach patient directly as he was at another appointment per wife who requested a call back at a later date.

## 2023-11-06 LAB — CMP14+EGFR
ALT: 22 [IU]/L (ref 0–44)
AST: 15 [IU]/L (ref 0–40)
Albumin: 4.4 g/dL (ref 3.9–4.9)
Alkaline Phosphatase: 138 [IU]/L — ABNORMAL HIGH (ref 44–121)
BUN/Creatinine Ratio: 18 (ref 10–24)
BUN: 22 mg/dL (ref 8–27)
Bilirubin Total: 0.6 mg/dL (ref 0.0–1.2)
CO2: 24 mmol/L (ref 20–29)
Calcium: 9.7 mg/dL (ref 8.6–10.2)
Chloride: 99 mmol/L (ref 96–106)
Creatinine, Ser: 1.25 mg/dL (ref 0.76–1.27)
Globulin, Total: 2.1 g/dL (ref 1.5–4.5)
Glucose: 158 mg/dL — ABNORMAL HIGH (ref 70–99)
Potassium: 3.7 mmol/L (ref 3.5–5.2)
Sodium: 139 mmol/L (ref 134–144)
Total Protein: 6.5 g/dL (ref 6.0–8.5)
eGFR: 63 mL/min/{1.73_m2} (ref >59)

## 2023-11-06 LAB — CBC WITH DIFFERENTIAL/PLATELET
Basophils Absolute: 0.1 10*3/uL (ref 0.0–0.2)
Basos: 1 %
EOS (ABSOLUTE): 0.2 10*3/uL (ref 0.0–0.4)
Eos: 1 %
Hematocrit: 49.6 % (ref 37.5–51.0)
Hemoglobin: 17.3 g/dL (ref 13.0–17.7)
Immature Grans (Abs): 0.1 10*3/uL (ref 0.0–0.1)
Immature Granulocytes: 1 %
Lymphocytes Absolute: 2.7 10*3/uL (ref 0.7–3.1)
Lymphs: 25 %
MCH: 32.3 pg (ref 26.6–33.0)
MCHC: 34.9 g/dL (ref 31.5–35.7)
MCV: 93 fL (ref 79–97)
Monocytes Absolute: 0.8 10*3/uL (ref 0.1–0.9)
Monocytes: 7 %
Neutrophils Absolute: 7.2 10*3/uL — ABNORMAL HIGH (ref 1.4–7.0)
Neutrophils: 65 %
Platelets: 282 10*3/uL (ref 150–450)
RBC: 5.36 x10E6/uL (ref 4.14–5.80)
RDW: 12.3 % (ref 11.6–15.4)
WBC: 11.1 10*3/uL — ABNORMAL HIGH (ref 3.4–10.8)

## 2023-11-06 LAB — VITAMIN D 25 HYDROXY (VIT D DEFICIENCY, FRACTURES): Vit D, 25-Hydroxy: 49.7 ng/mL (ref 30.0–100.0)

## 2023-11-06 LAB — THYROID PANEL WITH TSH
Free Thyroxine Index: 2.5 (ref 1.2–4.9)
T3 Uptake Ratio: 24 % (ref 24–39)
T4, Total: 10.3 ug/dL (ref 4.5–12.0)
TSH: 1.65 u[IU]/mL (ref 0.450–4.500)

## 2023-11-06 NOTE — Progress Notes (Signed)
 Attempted to reach patient regarding follow-up LDCT. Unable to reach patient directly as the line was busy. Unable to leave VM.

## 2023-11-16 ENCOUNTER — Other Ambulatory Visit: Payer: Self-pay | Admitting: Family Medicine

## 2023-11-16 DIAGNOSIS — J439 Emphysema, unspecified: Secondary | ICD-10-CM

## 2023-11-18 ENCOUNTER — Other Ambulatory Visit: Payer: Self-pay

## 2023-11-18 DIAGNOSIS — Z122 Encounter for screening for malignant neoplasm of respiratory organs: Secondary | ICD-10-CM

## 2023-11-18 DIAGNOSIS — Z87891 Personal history of nicotine dependence: Secondary | ICD-10-CM

## 2023-12-28 ENCOUNTER — Ambulatory Visit (HOSPITAL_COMMUNITY)
Admission: RE | Admit: 2023-12-28 | Discharge: 2023-12-28 | Disposition: A | Discharge status: Home / Self Care | Payer: 59 | Source: Ambulatory Visit | Attending: Oncology | Admitting: Oncology

## 2023-12-28 DIAGNOSIS — Z122 Encounter for screening for malignant neoplasm of respiratory organs: Secondary | ICD-10-CM | POA: Insufficient documentation

## 2023-12-28 DIAGNOSIS — Z87891 Personal history of nicotine dependence: Secondary | ICD-10-CM | POA: Diagnosis present

## 2024-01-14 ENCOUNTER — Other Ambulatory Visit: Payer: Self-pay | Admitting: Family Medicine

## 2024-01-14 DIAGNOSIS — E782 Mixed hyperlipidemia: Secondary | ICD-10-CM

## 2024-01-14 DIAGNOSIS — I700 Atherosclerosis of aorta: Secondary | ICD-10-CM

## 2024-01-20 NOTE — Progress Notes (Signed)
Patient notified of LDCT Lung Cancer Screening Results via mail with the recommendation to follow-up in 12 months. Patient's referring provider has been sent a copy of results. Results are as follows:   IMPRESSION: 1. Lung-RADS 2, benign appearance or behavior. Continue annual screening with low-dose chest CT without contrast in 12 months. 2.  Aortic atherosclerosis (ICD10-I70.0). 3.  Emphysema (ICD10-J43.9). 

## 2024-02-03 ENCOUNTER — Ambulatory Visit (INDEPENDENT_AMBULATORY_CARE_PROVIDER_SITE_OTHER): Payer: 59 | Admitting: Family Medicine

## 2024-02-03 ENCOUNTER — Encounter: Payer: Self-pay | Admitting: Family Medicine

## 2024-02-03 VITALS — BP 131/78 | HR 74 | Temp 96.5°F | Ht 68.0 in | Wt 173.0 lb

## 2024-02-03 DIAGNOSIS — J301 Allergic rhinitis due to pollen: Secondary | ICD-10-CM

## 2024-02-03 DIAGNOSIS — E559 Vitamin D deficiency, unspecified: Secondary | ICD-10-CM

## 2024-02-03 DIAGNOSIS — I10 Essential (primary) hypertension: Secondary | ICD-10-CM

## 2024-02-03 DIAGNOSIS — R7303 Prediabetes: Secondary | ICD-10-CM | POA: Diagnosis not present

## 2024-02-03 DIAGNOSIS — J449 Chronic obstructive pulmonary disease, unspecified: Secondary | ICD-10-CM | POA: Diagnosis not present

## 2024-02-03 DIAGNOSIS — E782 Mixed hyperlipidemia: Secondary | ICD-10-CM

## 2024-02-03 DIAGNOSIS — I700 Atherosclerosis of aorta: Secondary | ICD-10-CM | POA: Diagnosis not present

## 2024-02-03 DIAGNOSIS — K219 Gastro-esophageal reflux disease without esophagitis: Secondary | ICD-10-CM

## 2024-02-03 DIAGNOSIS — J309 Allergic rhinitis, unspecified: Secondary | ICD-10-CM | POA: Insufficient documentation

## 2024-02-03 LAB — BAYER DCA HB A1C WAIVED: HB A1C (BAYER DCA - WAIVED): 6.1 % — ABNORMAL HIGH (ref 4.8–5.6)

## 2024-02-03 MED ORDER — CETIRIZINE HCL 10 MG PO TABS: 10.0000 mg | ORAL_TABLET | Freq: Every day | ORAL | 11 refills | Status: DC

## 2024-02-03 MED ORDER — VITAMIN D (ERGOCALCIFEROL) 1.25 MG (50000 UNIT) PO CAPS: 50000.0000 [IU] | ORAL_CAPSULE | ORAL | 3 refills | Status: DC

## 2024-02-03 NOTE — Progress Notes (Signed)
 Subjective:  Patient ID: Richard Velez, male    DOB: 1956/05/02, 68 y.o.   MRN: [id number]  Patient Care Team: Sonny Masters, FNP as PCP - General (Family Medicine) Jena Gauss Gerrit Friends, MD as Consulting Physician (Gastroenterology)   Chief Complaint:  Medical Management of Chronic Issues (3 month follow up )   HPI: Richard Velez is a 68 y.o. male presenting on 02/03/2024 for Medical Management of Chronic Issues (3 month follow up )   Discussed the use of AI scribe software for clinical note transcription with the patient, who gave verbal consent to proceed.  History of Present Illness   Richard Velez is a 68 year old male with COPD and coronary artery disease who presents for follow-up after lung cancer screening.  He underwent lung cancer screening, which revealed emphysema changes and aortic atherosclerosis with calcifications in the left anterior descending artery (LAD).  He has coronary artery disease and is on aspirin and statin therapy. He denies chest pain but describes a sensation of heat that 'gets real hot' and then cools down, which he has not experienced recently. Previously, this sensation occurred fairly regularly but not too often. He experiences shortness of breath and fatigue upon exertion.  He has COPD and uses Trelegy without issues. Occasionally, he uses albuterol, particularly at night when he experiences wheezing, which he attributes to nodules. He takes two puffs before bed, which alleviates the wheezing, although he sometimes forgets to use it.  He is on vitamin D supplementation, taking it once a week. He denies any significant bone pain or trouble walking, although he has some pain in his arm due to a previous injury involving a plate.  Socially, he has been feeling depressed after the recent loss of a brother. He is the 18th of 19 children and has lost many siblings over the years. 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        Relevant past medical, surgical, family, and social history reviewed and updated as indicated.  Allergies and medications reviewed and updated. Data reviewed: Chart in Epic.   Past Medical History:  Diagnosis Date   Aortic atherosclerosis (HCC) 02/19/2021   Cataract    Emphysema lung (HCC) 02/19/2021   Enlarged prostate 01/10/2021   Essential hypertension 01/10/2021   Gastroesophageal reflux disease 01/10/2021   History of kidney stones    Lung nodules    Benign in appearance on low-dose lung cancer screening CT on 02/19/2021   Mixed hyperlipidemia 01/11/2021   Prediabetes 10/18/2021    Past Surgical History:  Procedure Laterality Date   BIOPSY  02/27/2021   Procedure: BIOPSY;  Surgeon: [name], MD;  [facility];  Service: Endoscopy;;  gastric esophageal   COLONOSCOPY N/A 02/27/2021   Two 5 to 6 mm polyps at the ileocecal valve (tubular adenomas), examination was otherwise normal.   ESOPHAGOGASTRODUODENOSCOPY N/A 02/27/2021   Distal esophageal stenosis/stricture.-**Dilated**   ESOPHAGOGASTRODUODENOSCOPY (EGD) WITH PROPOFOL N/A 09/12/2021   Procedure: ESOPHAGOGASTRODUODENOSCOPY (EGD) WITH PROPOFOL;  Surgeon: [name], MD;  [facility];  Service: Endoscopy;  Laterality: N/A;  2:45pm   EYE SURGERY Bilateral    cataract   MALONEY DILATION N/A 02/27/2021   Procedure: MALONEY DILATION;  Surgeon: [name], MD;  [facility];  Service: Endoscopy;  Laterality: N/A;   MALONEY DILATION N/A  09/12/2021   Procedure: Elease Hashimoto DILATION;  Surgeon: [name], MD;  [facility];  Service: Endoscopy;  Laterality: N/A;   POLYPECTOMY  02/27/2021   Procedure: POLYPECTOMY;  Surgeon: [name], MD;  [facility];  Service: Endoscopy;;  colon    Social History   Socioeconomic History   Marital status: Married    Spouse  name: [name]   Number of children: Not on file   Years of education: Not on file   Highest education level: Not on file  Occupational History   Occupation: Retired    Comment: Delivered ice  Tobacco Use   Smoking status: Every Day    Current packs/day: 1.00    Average packs/day: 1 pack/day for 53.1 years (53.1 ttl pk-yrs)    Types: Cigarettes    Start date: 01/11/1971   Smokeless tobacco: Never  Vaping Use   Vaping status: Never Used  Substance and Sexual Activity   Alcohol use: Yes    Comment: occ   Drug use: Never   Sexual activity: Not on file  Other Topics Concern   Not on file  Social History Narrative   Not on file   Social Drivers of Health   Financial Resource Strain: Low Risk  (10/14/2023)   Overall Financial Resource Strain (CARDIA)    Difficulty of Paying Living Expenses: Not hard at all  Food Insecurity: No Food Insecurity (10/14/2023)   Hunger Vital Sign    Worried About Running Out of Food in the Last Year: Never true    Ran Out of Food in the Last Year: Never true  Transportation Needs: No Transportation Needs (10/14/2023)   PRAPARE - Administrator, Civil Service (Medical): No    Lack of Transportation (Non-Medical): No  Physical Activity: Insufficiently Active (10/14/2023)   Exercise Vital Sign    Days of Exercise per Week: 3 days    Minutes of Exercise per Session: 30 min  Stress: No Stress Concern Present (10/14/2023)   [employer] of Occupational Health - Occupational Stress Questionnaire    Feeling of Stress : Not at all  Social Connections: Moderately Isolated (10/14/2023)   Social Connection and Isolation Panel [NHANES]    Frequency of Communication with Friends and Family: More than three times a week    Frequency of Social Gatherings with Friends and Family: Three times a week    Attends Religious Services: Never    Active Member of Clubs or Organizations: No    Attends Banker Meetings: Never    Marital Status:  Married  Catering manager Violence: Not At Risk (10/14/2023)   Humiliation, Afraid, Rape, and Kick questionnaire    Fear of Current or Ex-Partner: No    Emotionally Abused: No    Physically Abused: No    Sexually Abused: No    Outpatient Encounter Medications as of 02/03/2024  Medication Sig   albuterol (VENTOLIN HFA) 108 (90 Base) MCG/ACT inhaler Inhale 2 puffs into the lungs every 6 (six) hours as needed for wheezing or shortness of breath.   aspirin EC 81 MG tablet Take 1 tablet (81 mg total) by mouth daily. Swallow whole.   atorvastatin (LIPITOR) 10 MG tablet TAKE ONE TABLET BY MOUTH DAILY   cetirizine (ZYRTEC) 10 MG tablet Take 1 tablet (10 mg total) by mouth daily.   losartan-hydrochlorothiazide (HYZAAR) 50-12.5 MG tablet Take 1 tablet by mouth daily.   omeprazole (PRILOSEC) 40 MG capsule Take 1 capsule (40 mg total) by  mouth 2 (two) times daily.   TRELEGY ELLIPTA 100-62.5-25 MCG/ACT AEPB INHALE ONE PUFF DAILY   [DISCONTINUED] Vitamin D, Ergocalciferol, (DRISDOL) 1.25 MG (50000 UNIT) CAPS capsule Take 1 capsule (50,000 Units total) by mouth every 7 (seven) days.   Vitamin D, Ergocalciferol, (DRISDOL) 1.25 MG (50000 UNIT) CAPS capsule Take 1 capsule (50,000 Units total) by mouth every 7 (seven) days.   No facility-administered encounter medications on file as of 02/03/2024.    Allergies  Allergen Reactions   Lisinopril Cough   Oxycodone-Acetaminophen Itching and Nausea Only    Pertinent ROS per HPI, otherwise unremarkable      Objective:  BP 131/78   Pulse 74   Temp (!) 96.5 F (35.8 C)   Ht 5\' 8"  (1.727 m)   Wt 173 lb (78.5 kg)   SpO2 97%   BMI 26.30 kg/m    Wt Readings from Last 3 Encounters:  02/03/24 173 lb (78.5 kg)  11/05/23 174 lb (78.9 kg)  10/14/23 170 lb (77.1 kg)    Physical Exam Vitals and nursing note reviewed.  Constitutional:      Appearance: Normal appearance.  HENT:     Head: Normocephalic and atraumatic.     Right Ear: Ear canal and external  ear normal. A middle ear effusion is present. Tympanic membrane is not erythematous.     Left Ear: Ear canal and external ear normal. A middle ear effusion is present. Tympanic membrane is not erythematous.     Nose: Nose normal.     Mouth/Throat:     Mouth: Mucous membranes are moist.  Eyes:     Conjunctiva/sclera: Conjunctivae normal.     Pupils: Pupils are equal, round, and reactive to light.  Cardiovascular:     Rate and Rhythm: Normal rate and regular rhythm.     Heart sounds: Normal heart sounds.  Pulmonary:     Effort: Pulmonary effort is normal.     Breath sounds: Wheezing present.  Musculoskeletal:     Cervical back: Neck supple.     Right lower leg: No edema.     Left lower leg: No edema.  Skin:    General: Skin is warm and dry.     Capillary Refill: Capillary refill takes less than 2 seconds.  Neurological:     General: No focal deficit present.     Mental Status: He is alert and oriented to person, place, and time.  Psychiatric:        Mood and Affect: Mood normal.        Behavior: Behavior normal.        Thought Content: Thought content normal.        Judgment: Judgment normal.    Results for orders placed or performed in visit on 11/05/23  Bayer DCA Hb A1c Waived   Collection Time: 11/05/23  3:35 PM  Result Value Ref Range   HB A1C (BAYER DCA - WAIVED) 5.6 4.8 - 5.6 %  CBC with Differential/Platelet   Collection Time: 11/05/23  3:38 PM  Result Value Ref Range   WBC 11.1 (H) 3.4 - 10.8 x10E3/uL   RBC 5.36 4.14 - 5.80 x10E6/uL   Hemoglobin 17.3 13.0 - 17.7 g/dL   Hematocrit 78.2 95.6 - 51.0 %   MCV 93 79 - 97 fL   MCH 32.3 26.6 - 33.0 pg   MCHC 34.9 31.5 - 35.7 g/dL   RDW 21.3 08.6 - 57.8 %   Platelets 282 150 - 450 x10E3/uL   Neutrophils 65  Not Estab. %   Lymphs 25 Not Estab. %   Monocytes 7 Not Estab. %   Eos 1 Not Estab. %   Basos 1 Not Estab. %   Neutrophils Absolute 7.2 (H) 1.4 - 7.0 x10E3/uL   Lymphocytes Absolute 2.7 0.7 - 3.1 x10E3/uL    Monocytes Absolute 0.8 0.1 - 0.9 x10E3/uL   EOS (ABSOLUTE) 0.2 0.0 - 0.4 x10E3/uL   Basophils Absolute 0.1 0.0 - 0.2 x10E3/uL   Immature Granulocytes 1 Not Estab. %   Immature Grans (Abs) 0.1 0.0 - 0.1 x10E3/uL  CMP14+EGFR   Collection Time: 11/05/23  3:38 PM  Result Value Ref Range   Glucose 158 (H) 70 - 99 mg/dL   BUN 22 8 - 27 mg/dL   Creatinine, Ser 5.62 0.76 - 1.27 mg/dL   eGFR 63 >13 YQ/MVH/8.46   BUN/Creatinine Ratio 18 10 - 24   Sodium 139 134 - 144 mmol/L   Potassium 3.7 3.5 - 5.2 mmol/L   Chloride 99 96 - 106 mmol/L   CO2 24 20 - 29 mmol/L   Calcium 9.7 8.6 - 10.2 mg/dL   Total Protein 6.5 6.0 - 8.5 g/dL   Albumin 4.4 3.9 - 4.9 g/dL   Globulin, Total 2.1 1.5 - 4.5 g/dL   Bilirubin Total 0.6 0.0 - 1.2 mg/dL   Alkaline Phosphatase 138 (H) 44 - 121 IU/L   AST 15 0 - 40 IU/L   ALT 22 0 - 44 IU/L  VITAMIN D 25 Hydroxy (Vit-D Deficiency, Fractures)   Collection Time: 11/05/23  3:38 PM  Result Value Ref Range   Vit D, 25-Hydroxy 49.7 30.0 - 100.0 ng/mL  Thyroid Panel With TSH   Collection Time: 11/05/23  3:38 PM  Result Value Ref Range   TSH 1.650 0.450 - 4.500 uIU/mL   T4, Total 10.3 4.5 - 12.0 ug/dL   T3 Uptake Ratio 24 24 - 39 %   Free Thyroxine Index 2.5 1.2 - 4.9       Pertinent labs & imaging results that were available during my care of the patient were reviewed by me and considered in my medical decision making.  Assessment & Plan:  Richard Velez was seen today for medical management of chronic issues.  Diagnoses and all orders for this visit:  COPD without exacerbation (HCC) Well controlled   Aortic atherosclerosis (HCC) -     Ambulatory referral to Cardiology  Mixed hyperlipidemia -     Ambulatory referral to Cardiology  Prediabetes -     Bayer DCA Hb A1c Waived -     Ambulatory referral to Cardiology  Essential hypertension -     Ambulatory referral to Cardiology  Gastroesophageal reflux disease without esophagitis Well controlled   Vitamin D  deficiency -     Vitamin D, Ergocalciferol, (DRISDOL) 1.25 MG (50000 UNIT) CAPS capsule; Take 1 capsule (50,000 Units total) by mouth every 7 (seven) days. -     VITAMIN D 25 Hydroxy (Vit-D Deficiency, Fractures)  Seasonal allergic rhinitis due to pollen -     cetirizine (ZYRTEC) 10 MG tablet; Take 1 tablet (10 mg total) by mouth daily.     Assessment and Plan    Aortic Atherosclerosis Aortic atherosclerosis identified on lung cancer screening with calcifications in the left anterior descending artery, indicating potential risk for coronary artery disease. He experiences intermittent chest heat but no chest pain. Currently on aspirin and statin therapy. Referral to cardiology is planned to evaluate ischemic changes during exertion and determine the need  for a stress test or CT cardiac scoring, with preference for CT if covered by insurance. - Refer to cardiology for evaluation and management. - Determine appropriateness of stress test versus CT cardiac scoring based on cardiology consultation and insurance coverage.  Chronic Obstructive Pulmonary Disease (COPD) with Emphysema COPD with emphysema changes noted on lung cancer screening. Managed with Trelegy and occasional albuterol for nocturnal wheezing. No significant issues with current regimen. - Continue Trelegy as prescribed. - Use albuterol as needed for wheezing.  Prediabetes Prediabetes is a concern due to potential ischemic changes during exertion. No increased hunger, thirst, or urination reported. A1c levels to be rechecked to monitor glycemic control. - Check A1c levels today.  Vitamin D Deficiency On weekly vitamin D supplementation. No significant bone pain or mobility issues, except for pain related to a previous arm injury with a plate. - Check vitamin D levels today.  General Health Maintenance Cholesterol levels need monitoring as part of ongoing health maintenance. Advised to increase physical activity with warmer  weather. - Check cholesterol levels at the next visit.          Continue all other maintenance medications.  Follow up plan: Return if symptoms worsen or fail to improve.   Continue healthy lifestyle choices, including diet (rich in fruits, vegetables, and lean proteins, and low in salt and simple carbohydrates) and exercise (at least 30 minutes of moderate physical activity daily).  Educational handout given for prediabetes  The above assessment and management plan was discussed with the patient. The patient verbalized understanding of and has agreed to the management plan. Patient is aware to call the clinic if they develop any new symptoms or if symptoms persist or worsen. Patient is aware when to return to the clinic for a follow-up visit. Patient educated on when it is appropriate to go to the emergency department.   Kari Baars, FNP-C [facility] [phone]

## 2024-02-04 LAB — VITAMIN D 25 HYDROXY (VIT D DEFICIENCY, FRACTURES): Vit D, 25-Hydroxy: 60.8 ng/mL (ref 30.0–100.0)

## 2024-02-15 ENCOUNTER — Other Ambulatory Visit: Payer: Self-pay | Admitting: Family Medicine

## 2024-02-15 DIAGNOSIS — J439 Emphysema, unspecified: Secondary | ICD-10-CM

## 2024-02-18 ENCOUNTER — Encounter: Payer: Self-pay | Admitting: Gastroenterology

## 2024-03-16 NOTE — Progress Notes (Unsigned)
 Cardiology Office Note   Date:  03/17/2024   ID:  Richard Velez, DOB 1955/11/04, MRN [id number]  PCP:  Galvin Jules, FNP  Cardiologist:   None Referring:  Galvin Jules, FNP  No chief complaint on file.   History of Present Illness: Richard Velez is a 68 y.o. male who presents for evaluation of aortic atherosclerosis.  He has routine CTs for screening with his history of tobacco abuse and was noted to have aortic atherosclerosis possibly extending into the LAD.  He has not had any prior cardiac testing.  He is somewhat physically active.  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  He denies any cardiovascular symptoms though he does have some shortness of breath with activities.  He has ascribed this to his chronic lung disease.  He might get dyspneic walking 100 yards on level ground.  He says he can make it up a flight of stairs without being short of breath.  He will be winded at the top.  He denies any PND or orthopnea.  Has had no palpitations, presyncope or syncope.   Past Medical History:  Diagnosis Date   Aortic atherosclerosis (HCC) 02/19/2021   Cataract    Emphysema lung (HCC) 02/19/2021   Enlarged prostate 01/10/2021   Essential hypertension 01/10/2021   Gastroesophageal reflux disease 01/10/2021   History of kidney stones    Lung nodules    Benign in appearance on low-dose lung cancer screening CT on 02/19/2021   Mixed hyperlipidemia 01/11/2021   Prediabetes 10/18/2021    Past Surgical History:  Procedure Laterality Date   BIOPSY  02/27/2021   Procedure: BIOPSY;  Surgeon: Suzette Espy, MD;  [facility];  Service: Endoscopy;;  gastric esophageal   COLONOSCOPY N/A 02/27/2021   Two 5 to 6 mm polyps at the ileocecal valve (tubular adenomas), examination was otherwise normal.   ESOPHAGOGASTRODUODENOSCOPY N/A 02/27/2021   Distal esophageal stenosis/stricture.-**Dilated**   ESOPHAGOGASTRODUODENOSCOPY (EGD)  WITH PROPOFOL  N/A 09/12/2021   Procedure: ESOPHAGOGASTRODUODENOSCOPY (EGD) WITH PROPOFOL ;  Surgeon: Suzette Espy, MD;  [facility];  Service: Endoscopy;  Laterality: N/A;  2:45pm   EYE SURGERY Bilateral    cataract   MALONEY DILATION N/A 02/27/2021   Procedure: MALONEY DILATION;  Surgeon: Suzette Espy, MD;  [facility];  Service: Endoscopy;  Laterality: N/A;   MALONEY DILATION N/A 09/12/2021   Procedure: [name] DILATION;  Surgeon: Suzette Espy, MD;  [facility];  Service: Endoscopy;  Laterality: N/A;   POLYPECTOMY  02/27/2021   Procedure: POLYPECTOMY;  Surgeon: Suzette Espy, MD;  [facility];  Service: Endoscopy;;  colon     Current Outpatient Medications  Medication Sig Dispense Refill   albuterol  (VENTOLIN  HFA) 108 (90 Base) MCG/ACT inhaler Inhale 2 puffs into the lungs every 6 (six) hours as needed for wheezing or shortness of breath. 8 g 11   aspirin  EC 81 MG tablet Take 1 tablet (81 mg total) by mouth daily. Swallow whole. 90 tablet 1   atorvastatin  (LIPITOR) 10 MG tablet TAKE ONE TABLET BY MOUTH DAILY 90 tablet 0   cetirizine  (ZYRTEC ) 10 MG tablet Take 1 tablet (10 mg total) by mouth daily. 30 tablet 11   losartan -hydrochlorothiazide  (HYZAAR) 50-12.5 MG tablet Take 1 tablet by mouth daily. 90 tablet 1   omeprazole  (PRILOSEC) 40 MG capsule Take 1 capsule (40 mg total) by mouth 2 (two) times daily. 180 capsule  2   TRELEGY ELLIPTA  100-62.5-25 MCG/ACT AEPB INHALE ONE PUFF DAILY 60 each 2   Vitamin D , Ergocalciferol , (DRISDOL ) 1.25 MG (50000 UNIT) CAPS capsule Take 1 capsule (50,000 Units total) by mouth every 7 (seven) days. 5 capsule 3   No current facility-administered medications for this visit.    Allergies:   Lisinopril  and Oxycodone-acetaminophen    Social History:  The patient  reports that he has been smoking cigarettes. He started smoking about 53 years ago. He has a 53.2 pack-year smoking history. He has never used  smokeless tobacco. He reports current alcohol use. He reports that he does not use drugs.   Family History:  The patient's family history includes Diabetes in his brother, mother, and sister; Heart attack in his father; Hypertension in his mother.    ROS:  Please see the history of present illness.   Otherwise, review of systems are positive for none.   All other systems are reviewed and negative.    PHYSICAL EXAM: VS:  BP 120/72   Pulse 78   Ht 5\' 8"  (1.727 m)   Wt 171 lb (77.6 kg)   SpO2 94%   BMI 26.00 kg/m  , BMI Body mass index is 26 kg/m. GENERAL:  Well appearing HEENT:  Pupils equal round and reactive, fundi not visualized, oral mucosa unremarkable NECK:  No jugular venous distention, waveform within normal limits, carotid upstroke brisk and symmetric, no bruits, no thyromegaly LYMPHATICS:  No cervical, inguinal adenopathy LUNGS:  Clear to auscultation bilaterally BACK:  No CVA tenderness CHEST:  Unremarkable HEART:  PMI not displaced or sustained,S1 and S2 within normal limits, no S3, no S4, no clicks, no rubs, no murmurs ABD:  Flat, positive bowel sounds normal in frequency in pitch, no bruits, no rebound, no guarding, no midline pulsatile mass, no hepatomegaly, no splenomegaly EXT:  2 plus pulses throughout, no edema, no cyanosis no clubbing SKIN:  No rashes no nodules NEURO:  Cranial nerves II through XII grossly intact, motor grossly intact throughout [facility]:  Cognitively intact, oriented to person place and time    EKG:  EKG Interpretation Date/Time:  Thursday Mar 17 2024 15:10:41 EDT Ventricular Rate:  76 PR Interval:  162 QRS Duration:  96 QT Interval:  386 QTC Calculation: 434 R Axis:   3  Text Interpretation: Sinus rhythm with frequent Premature ventricular complexes When compared with ECG of 10-Sep-2021 13:34, Premature ventricular complexes are now Present Confirmed by Eilleen Grates ([id number]) on 03/17/2024 3:40:34 PM     Recent Labs: 04/01/2023:  Magnesium 2.3 11/05/2023: ALT 22; BUN 22; Creatinine, Ser 1.25; Hemoglobin 17.3; Platelets 282; Potassium 3.7; Sodium 139; TSH 1.650    Lipid Panel    Component Value Date/Time   CHOL 124 05/05/2023 1419   TRIG 136 05/05/2023 1419   HDL 36 (L) 05/05/2023 1419   CHOLHDL 3.6 10/14/2022 1116   LDLCALC 64 05/05/2023 1419      Wt Readings from Last 3 Encounters:  03/17/24 171 lb (77.6 kg)  02/03/24 173 lb (78.5 kg)  11/05/23 174 lb (78.9 kg)      Other studies Reviewed: Additional studies/ records that were reviewed today include: Labs, CT. Review of the above records demonstrates:  Please see elsewhere in the note.     ASSESSMENT AND PLAN:  Aortic atherosclerosis: The patient needs primary risk reduction.  See below.  SOB: The patient does have shortness of breath with that is most likely related to previous diagnosis of COPD.  However, this could  be an anginal equivalent. I will bring the patient back for a POET (Plain Old Exercise Test). This will allow me to screen for obstructive coronary disease, risk stratify and very importantly provide a prescription for exercise.  Tobacco: We talked about the need to stop smoking.  He really does not want to quit smoking.  He is not on a prescription for Chantix.   Current medicines are reviewed at length with the patient today.  The patient does not have concerns regarding medicines.  The following changes have been made:  no change  Labs/ tests ordered today include:   Orders Placed This Encounter  Procedures   EKG 12-Lead     Disposition:   FU with me as needed.     Signed, Eilleen Grates, MD  03/17/2024 3:45 PM    [facility]

## 2024-03-17 ENCOUNTER — Ambulatory Visit (INDEPENDENT_AMBULATORY_CARE_PROVIDER_SITE_OTHER): Admitting: Cardiology

## 2024-03-17 ENCOUNTER — Encounter: Payer: Self-pay | Admitting: Cardiology

## 2024-03-17 VITALS — BP 120/72 | HR 78 | Ht 68.0 in | Wt 171.0 lb

## 2024-03-17 DIAGNOSIS — R079 Chest pain, unspecified: Secondary | ICD-10-CM | POA: Diagnosis not present

## 2024-03-17 DIAGNOSIS — I700 Atherosclerosis of aorta: Secondary | ICD-10-CM | POA: Diagnosis not present

## 2024-03-17 NOTE — Patient Instructions (Signed)
 Medication Instructions:   Your physician recommends that you continue on your current medications as directed. Please refer to the Current Medication list given to you today.  Labwork: None   Testing/Procedures:   Your physician has requested that you have an exercise tolerance test. For further information please visit [email or web address] Please also follow instruction sheet, as given.  Follow-Up: As needed  Any Other Special Instructions Will Be Listed Below (If Applicable).  If you need a refill on your cardiac medications before your next appointment, please call your pharmacy.    Thank you for choosing [facility] !

## 2024-03-25 ENCOUNTER — Ambulatory Visit (HOSPITAL_COMMUNITY)
Admission: RE | Admit: 2024-03-25 | Discharge: 2024-03-25 | Disposition: A | Discharge status: Home / Self Care | Source: Ambulatory Visit | Attending: Cardiology | Admitting: Cardiology

## 2024-03-25 DIAGNOSIS — R079 Chest pain, unspecified: Secondary | ICD-10-CM | POA: Diagnosis present

## 2024-03-25 LAB — EXERCISE TOLERANCE TEST
Angina Index: 0
Duke Treadmill Score: 3
Estimated workload: 4.6
Exercise duration (min): 2 min
Exercise duration (sec): 41 s
MPHR: 152 {beats}/min
Peak HR: 122 {beats}/min
Percent HR: 80 %
Rest HR: 85 {beats}/min
ST Depression (mm): 0 mm

## 2024-03-28 ENCOUNTER — Ambulatory Visit: Payer: Self-pay | Admitting: Cardiology

## 2024-03-31 ENCOUNTER — Ambulatory Visit (INDEPENDENT_AMBULATORY_CARE_PROVIDER_SITE_OTHER): Admitting: Gastroenterology

## 2024-03-31 ENCOUNTER — Encounter: Payer: Self-pay | Admitting: Gastroenterology

## 2024-03-31 VITALS — BP 138/75 | HR 89 | Temp 98.8°F | Ht 68.0 in | Wt 177.2 lb

## 2024-03-31 DIAGNOSIS — Z860101 Personal history of adenomatous and serrated colon polyps: Secondary | ICD-10-CM

## 2024-03-31 DIAGNOSIS — R748 Abnormal levels of other serum enzymes: Secondary | ICD-10-CM | POA: Insufficient documentation

## 2024-03-31 DIAGNOSIS — R1310 Dysphagia, unspecified: Secondary | ICD-10-CM

## 2024-03-31 DIAGNOSIS — K219 Gastro-esophageal reflux disease without esophagitis: Secondary | ICD-10-CM

## 2024-03-31 DIAGNOSIS — R1319 Other dysphagia: Secondary | ICD-10-CM

## 2024-03-31 NOTE — Progress Notes (Signed)
 Gastroenterology Office Note     Primary Care Physician:  Galvin Jules, FNP  Primary Gastroenterologist: Dr. Riley Cheadle    Chief Complaint   Chief Complaint  Patient presents with   Follow-up    Follow up on GERD     History of Present Illness   Richard Velez is a 68 y.o. male presenting today with a history of GERD, dysphagia, adenomas with next colonoscopy in 2027. Last dilation in Nov 2022 of Schatzki ring. Here for routine refills.   Now noting solid food dysphagia. When first started eating, feels hung up and then slowly gets better. Did better after dilation in 2022.   Omeprazole  40 BID. Occasional GERD flares depending on food choices. No melena or hematochezia. No abdominal pain. No weight loss or lack of appetite. No change in bowel habits.    On CT chest Feb 2025 showed slight thickening of lower esophageal wall.   Upon review of labs, has had isolated mildly elevated alk phos for past few years.   Last EGD Nov 2022: moderate Schatzki ring s/p dilation, medium-sized hiatal hernia, normal duodenum.    Colonoscopy April 2022: Two 5 to 6 mm polyps at the ileocecal valve (tubular adenomas), examination was otherwise normal.    Past Medical History:  Diagnosis Date   Aortic atherosclerosis (HCC) 02/19/2021   Cataract    Emphysema lung (HCC) 02/19/2021   Enlarged prostate 01/10/2021   Essential hypertension 01/10/2021   Gastroesophageal reflux disease 01/10/2021   History of kidney stones    Lung nodules    Benign in appearance on low-dose lung cancer screening CT on 02/19/2021   Mixed hyperlipidemia 01/11/2021   Prediabetes 10/18/2021    Past Surgical History:  Procedure Laterality Date   BIOPSY  02/27/2021   Procedure: BIOPSY;  Surgeon: Suzette Espy, MD;  [facility]: [facility];  Service: Endoscopy;;  gastric esophageal   COLONOSCOPY N/A 02/27/2021   Two 5 to 6 mm polyps at the ileocecal valve (tubular adenomas), examination was otherwise normal.    ESOPHAGOGASTRODUODENOSCOPY N/A 02/27/2021   Distal esophageal stenosis/stricture.-**Dilated**   ESOPHAGOGASTRODUODENOSCOPY (EGD) WITH PROPOFOL  N/A 09/12/2021   Procedure: ESOPHAGOGASTRODUODENOSCOPY (EGD) WITH PROPOFOL ;  Surgeon: Suzette Espy, MD;  Location: [facility];  Service: Endoscopy;  Laterality: N/A;  2:45pm   EYE SURGERY Bilateral    cataract   MALONEY DILATION N/A 02/27/2021   Procedure: MALONEY DILATION;  Surgeon: Suzette Espy, MD;  Location: [facility];  Service: Endoscopy;  Laterality: N/A;   MALONEY DILATION N/A 09/12/2021   Procedure: [name] DILATION;  Surgeon: Suzette Espy, MD;  Location: [facility];  Service: Endoscopy;  Laterality: N/A;   POLYPECTOMY  02/27/2021   Procedure: POLYPECTOMY;  Surgeon: Suzette Espy, MD;  Location: [facility];  Service: Endoscopy;;  colon   WRIST SURGERY      Current Outpatient Medications  Medication Sig Dispense Refill   albuterol  (VENTOLIN  HFA) 108 (90 Base) MCG/ACT inhaler Inhale 2 puffs into the lungs every 6 (six) hours as needed for wheezing or shortness of breath. 8 g 11   aspirin  EC 81 MG tablet Take 1 tablet (81 mg total) by mouth daily. Swallow whole. 90 tablet 1   atorvastatin  (LIPITOR) 10 MG tablet TAKE ONE TABLET BY MOUTH DAILY 90 tablet 0   losartan -hydrochlorothiazide  (HYZAAR) 50-12.5 MG tablet Take 1 tablet by mouth daily. 90 tablet 1   magnesium gluconate (MAGONATE) 500 (27 Mg) MG TABS tablet Take 500 mg by mouth  in the morning and at bedtime.     omeprazole  (PRILOSEC) 40 MG capsule Take 1 capsule (40 mg total) by mouth 2 (two) times daily. 180 capsule 2   TRELEGY ELLIPTA  100-62.5-25 MCG/ACT AEPB INHALE ONE PUFF DAILY 60 each 2   Vitamin D , Ergocalciferol , (DRISDOL ) 1.25 MG (50000 UNIT) CAPS capsule Take 1 capsule (50,000 Units total) by mouth every 7 (seven) days. 5 capsule 3   cetirizine  (ZYRTEC ) 10 MG tablet Take 1 tablet (10 mg total) by mouth daily. (Patient not taking: Reported on 03/31/2024) 30  tablet 11   No current facility-administered medications for this visit.    Allergies as of 03/31/2024 - Review Complete 03/31/2024  Allergen Reaction Noted   Lisinopril  Cough 02/21/2021   Oxycodone-acetaminophen Itching and Nausea Only 12/17/2017    Family History  Problem Relation Age of Onset   Hypertension Mother    Diabetes Mother    Heart attack Father 52   Diabetes Sister    Heart attack Brother 57   Colon cancer Neg Hx    Gastric cancer Neg Hx    Esophageal cancer Neg Hx     Social History   Socioeconomic History   Marital status: Married    Spouse name: Richard Velez   Number of children: Not on file   Years of education: Not on file   Highest education level: Not on file  Occupational History   Occupation: Retired    Comment: Delivered ice  Tobacco Use   Smoking status: Every Day    Current packs/day: 1.00    Average packs/day: 1 pack/day for 53.2 years (53.2 ttl pk-yrs)    Types: Cigarettes    Start date: 01/11/1971   Smokeless tobacco: Never  Vaping Use   Vaping status: Never Used  Substance and Sexual Activity   Alcohol use: Yes    Comment: occ   Drug use: Never   Sexual activity: Not on file  Other Topics Concern   Not on file  Social History Narrative   One child.  Lives with wife.  He used to deliver ice.     Social Drivers of Corporate investment banker Strain: Low Risk  (10/14/2023)   Overall Financial Resource Strain (CARDIA)    Difficulty of Paying Living Expenses: Not hard at all  Food Insecurity: No Food Insecurity (10/14/2023)   Hunger Vital Sign    Worried About Running Out of Food in the Last Year: Never true    Ran Out of Food in the Last Year: Never true  Transportation Needs: No Transportation Needs (10/14/2023)   PRAPARE - Administrator, Civil Service (Medical): No    Lack of Transportation (Non-Medical): No  Physical Activity: Insufficiently Active (10/14/2023)   Exercise Vital Sign    Days of Exercise per Week: 3  days    Minutes of Exercise per Session: 30 min  Stress: No Stress Concern Present (10/14/2023)   [employer] of Occupational Health - Occupational Stress Questionnaire    Feeling of Stress : Not at all  Social Connections: Moderately Isolated (10/14/2023)   Social Connection and Isolation Panel [NHANES]    Frequency of Communication with Friends and Family: More than three times a week    Frequency of Social Gatherings with Friends and Family: Three times a week    Attends Religious Services: Never    Active Member of Clubs or Organizations: No    Attends Banker Meetings: Never    Marital Status: Married  Intimate  Partner Violence: Not At Risk (10/14/2023)   Humiliation, Afraid, Rape, and Kick questionnaire    Fear of Current or Ex-Partner: No    Emotionally Abused: No    Physically Abused: No    Sexually Abused: No     Review of Systems   Gen: Denies any fever, chills, fatigue, weight loss, lack of appetite.  CV: Denies chest pain, heart palpitations, peripheral edema, syncope.  Resp: Denies shortness of breath at rest or with exertion. Denies wheezing or cough.  GI: Denies dysphagia or odynophagia. Denies jaundice, hematemesis, fecal incontinence. GU : Denies urinary burning, urinary frequency, urinary hesitancy MS: Denies joint pain, muscle weakness, cramps, or limitation of movement.  Derm: Denies rash, itching, dry skin Psych: Denies depression, anxiety, memory loss, and confusion Heme: Denies bruising, bleeding, and enlarged lymph nodes.   Physical Exam   BP 138/75   Pulse 89   Temp 98.8 F (37.1 C)   Ht 5\' 8"  (1.727 m)   Wt 177 lb 3.2 oz (80.4 kg)   BMI 26.94 kg/m  General:   Alert and oriented. Pleasant and cooperative. Well-nourished and well-developed.  Head:  Normocephalic and atraumatic. Eyes:  Without icterus Abdomen:  +BS, soft, non-tender and non-distended. No HSM noted. No guarding or rebound. No masses appreciated.  Rectal:   Deferred  Msk:  Symmetrical without gross deformities. Normal posture. Extremities:  Without edema. Neurologic:  Alert and  oriented x4;  grossly normal neurologically. Skin:  Intact without significant lesions or rashes. Psych:  Alert and cooperative. Normal mood and affect.   Assessment   Richard Velez is a 68 y.o. male presenting today with a history of  GERD, dysphagia, adenomas, for yearly follow-up and now noting recurrent dysphagia.  Dysphagia: last EGD in 2022 with improvement s/p dilation of moderate Schatzki ring. Notably, CT chest Feb 2025 showed slight thickening of lower esophageal wall. Will arrange EGD/dilation in near future.  GERD: continue with omeprazole  40 mg BID.  Hx of adenomas: due for surveillance in 2027. No concerning lower GI signs/symptoms.   Elevated alk phos: mild isolated elevation with otherwise normal LFTs. Noted for past few years. Will update labs and include GGT, isoenzymes, AMA. After review can order US  liver if needed. He prefers to have this drawn in July when he sees his PCP. Orders placed today for [facility].   PLAN    Continue omeprazole  40 mg BID Proceed with upper endoscopy/dilation by Dr. Riley Cheadle in near future: the risks, benefits, and alternatives have been discussed with the patient in detail. The patient states understanding and desires to proceed.  HFP, GGT, alk phos isoenzymes, AMA in July when seeing PCP. Orders placed. US  abdomen can be ordered thereafter Return in 3 months regardless   Delman Ferns, PhD, ANP-BC [facility]

## 2024-03-31 NOTE — Patient Instructions (Signed)
 We are arranging an upper endoscopy with dilation by Dr. Riley Cheadle.   I have ordered labs, and you can wait until you see primary care in July in case more are needed. The orders are in [facility].  We will see you in 3 months!  I enjoyed seeing you again today! I value our relationship and want to provide genuine, compassionate, and quality care. You may receive a survey regarding your visit with me, and I welcome your feedback! Thanks so much for taking the time to complete this. I look forward to seeing you again.      Delman Ferns, PhD, ANP-BC [facility]

## 2024-04-01 ENCOUNTER — Telehealth: Payer: Self-pay | Admitting: *Deleted

## 2024-04-01 NOTE — Telephone Encounter (Signed)
 Spoke with pt. Scheduled EGD/ED with Dr. Riley Cheadle 6/23 at 2:30pm. Aware will call back with pre-op appt.

## 2024-04-04 NOTE — Telephone Encounter (Signed)
Patient aware of pre op appt

## 2024-04-13 ENCOUNTER — Other Ambulatory Visit: Payer: Self-pay | Admitting: Gastroenterology

## 2024-04-20 NOTE — Patient Instructions (Signed)
 Richard Velez  04/20/2024     @[email or web address] @   Your procedure is scheduled on  04/25/2024.   Report to [facility] at  1230 P.M.   Call this number if you have problems the morning of surgery:  [phone]  If you experience any cold or flu symptoms such as cough, fever, chills, shortness of breath, etc. between now and your scheduled surgery, please notify us  at the above number.   Remember:        Use your inhaler before you come and bring your rescue inhaler with you.        DO NOT take any medications for diabetes the morning of your procedure.    Follow the diet instructions given to you by the office.   You may drink clear liquids until  1030 am on 04/25/2024.    Clear liquids allowed are:                    Water , Juice (No red color; non-citric and without pulp; diabetics please choose diet or no sugar options), Carbonated beverages (diabetics please choose diet or no sugar options), Clear Tea (No creamer, milk, or cream, including half & half and powdered creamer), Black Coffee Only (No creamer, milk or cream, including half & half and powdered creamer), and Clear Sports drink (No red color; diabetics please choose diet or no sugar options)    Take these medicines the morning of surgery with A SIP OF WATER                                                         None.    Do not wear jewelry, make-up or nail polish, including gel polish,  artificial nails, or any other type of covering on natural nails (fingers and  toes).  Do not wear lotions, powders, or perfumes, or deodorant.  Do not shave 48 hours prior to surgery.  Men may shave face and neck.  Do not bring valuables to the hospital.  [facility] is not responsible for any belongings or valuables.  Contacts, dentures or bridgework may not be worn into surgery.  Leave your suitcase in the car.  After surgery it may be brought to your room.  For patients admitted to the hospital, discharge time will  be determined by your treatment team.  Patients discharged the day of surgery will not be allowed to drive home and must have someone with them for 24 hours.    Special instructions:   DO NOT smoke tobacco or vape for 24 hours before your procedure.  Please read over the following fact sheets that you were given. Anesthesia Post-op Instructions and Care and Recovery After Surgery      Upper Endoscopy, Adult, Care After After the procedure, it is common to have a sore throat. It is also common to have: Mild stomach pain or discomfort. Bloating. Nausea. Follow these instructions at home: The instructions below may help you care for yourself at home. Your health care provider may give you more instructions. If you have questions, ask your health care provider. If you were given a sedative during the procedure, it can affect you for several hours. Do not drive or operate machinery until your health care provider says that it is safe. If  you will be going home right after the procedure, plan to have a responsible adult: Take you home from the hospital or clinic. You will not be allowed to drive. Care for you for the time you are told. Follow instructions from your health care provider about what you may eat and drink. Return to your normal activities as told by your health care provider. Ask your health care provider what activities are safe for you. Take over-the-counter and prescription medicines only as told by your health care provider. Contact a health care provider if you: Have a sore throat that lasts longer than one day. Have trouble swallowing. Have a fever. Get help right away if you: Vomit blood or your vomit looks like coffee grounds. Have bloody, black, or tarry stools. Have a very bad sore throat or you cannot swallow. Have difficulty breathing or very bad pain in your chest or abdomen. These symptoms may be an emergency. Get help right away. Call 911. Do not wait to see  if the symptoms will go away. Do not drive yourself to the hospital. Summary After the procedure, it is common to have a sore throat, mild stomach discomfort, bloating, and nausea. If you were given a sedative during the procedure, it can affect you for several hours. Do not drive until your health care provider says that it is safe. Follow instructions from your health care provider about what you may eat and drink. Return to your normal activities as told by your health care provider. This information is not intended to replace advice given to you by your health care provider. Make sure you discuss any questions you have with your health care provider. Document Revised: 01/29/2022 Document Reviewed: 01/29/2022 Elsevier Patient Education  2024 Elsevier Inc.Esophageal Dilatation Esophageal dilatation, or dilation, is done to stretch a blocked or narrowed part of your esophagus. The esophagus is the part of your body that moves food from your mouth to your stomach. You may need to have it stretched if: You have a lot of scar tissue and it makes it hard or painful to swallow. You have cancer of the esophagus. There's a problem with how food moves through your esophagus. In some cases, you may need to have this procedure done more than once. Tell a health care provider about: Any allergies you have. All medicines you're taking, including vitamins, herbs, eye drops, creams, and over-the-counter medicines. Any problems you or family members have had with anesthesia. Any bleeding problems you have. Any surgeries you've had. Any medical conditions you have. Whether you're pregnant or may be pregnant. What are the risks? Your health care provider will talk with you about risks. These may include: Bleeding. A hole or tear in your esophagus. What happens before the procedure? When to stop eating and drinking Follow instructions from your provider about what you may eat and drink. These may  include: 8 hours before your procedure Stop eating most foods. Do not eat meat, fried foods, or fatty foods. Eat only light foods, such as toast or crackers. All liquids are okay except energy drinks and alcohol. 6 hours before your procedure Stop eating. Drink only clear liquids, such as water , clear fruit juice, black coffee, plain tea, and sports drinks. Do not drink energy drinks or alcohol. 2 hours before your procedure Stop drinking all liquids. You may be allowed to take medicines with small sips of water . If you don't follow your provider's instructions, your procedure may be delayed or canceled. Medicines Ask your  provider about: Changing or stopping your regular medicines. These include any diabetes medicines or blood thinners you take. Taking medicines such as aspirin  and ibuprofen. These medicines can thin your blood. Do not take them unless your provider tells you to. Taking over-the-counter medicines, vitamins, herbs, and supplements. General instructions If you'll be going home right after the procedure, plan to have a responsible adult: Take you home from the hospital or clinic. You won't be allowed to drive. Care for you for the time you're told. What happens during the procedure? You may be given: A sedative. This helps you relax. Anesthesia. This keeps you from feeling pain. It will numb certain areas of your body. The stretching may be done with: Simple dilators. These are tools put in your esophagus to stretch it. Guide wires. These wires are put in using a tube called an endoscope. A dilator is put over the wires to stretch your esophagus. Then the wires are taken out. A balloon. The balloon is on the end of a tube. It's inflated to stretch your esophagus. The procedure may vary among providers and hospitals. What can I expect after the procedure? Your blood pressure, heart rate, breathing rate, and blood oxygen level will be monitored until you leave the  hospital or clinic. Your throat may feel sore and numb. This will get better over time. You won't be allowed to eat or drink until your throat is no longer numb. You may be able to go home when you can: Drink. Pee. Sit on the edge of the bed without nausea or dizziness. Follow these instructions at home: Activity If you were given a sedative during the procedure, it can affect you for several hours. Do not drive or operate machinery until your provider says it's safe. Return to your normal activities as told by your provider. Ask your provider what activities are safe for you. General instructions Take over-the-counter and prescription medicines only as told by your provider. Follow instructions from your provider about what you may eat and drink. Do not use any products that contain nicotine or tobacco. These products include cigarettes, chewing tobacco, and vaping devices, such as e-cigarettes. If you need help quitting, ask your provider. Keep all follow-up visits. Your provider will make sure the procedure worked. Where to find more information American Society for Gastrointestinal Endoscopy (ASGE): asge.org Contact a health care provider if: You have trouble swallowing. You have a fever. Your pain doesn't get better with medicine. Get help right away if: You have chest pain. You have trouble breathing. You vomit blood. Your poop is: Black. Tarry. Bloody. These symptoms may be an emergency. Get help right away. Call 911. Do not wait to see if the symptoms will go away. Do not drive yourself to the hospital. This information is not intended to replace advice given to you by your health care provider. Make sure you discuss any questions you have with your health care provider. Document Revised: 01/16/2023 Document Reviewed: 01/16/2023 Elsevier Patient Education  2024 Elsevier Inc.General Anesthesia, Adult, Care After The following information offers guidance on how to care for  yourself after your procedure. Your health care provider may also give you more specific instructions. If you have problems or questions, contact your health care provider. What can I expect after the procedure? After the procedure, it is common for people to: Have pain or discomfort at the IV site. Have nausea or vomiting. Have a sore throat or hoarseness. Have trouble concentrating. Feel cold or chills. Feel  weak, sleepy, or tired (fatigue). Have soreness and body aches. These can affect parts of the body that were not involved in surgery. Follow these instructions at home: For the time period you were told by your health care provider:  Rest. Do not participate in activities where you could fall or become injured. Do not drive or use machinery. Do not drink alcohol. Do not take sleeping pills or medicines that cause drowsiness. Do not make important decisions or sign legal documents. Do not take care of children on your own. General instructions Drink enough fluid to keep your urine pale yellow. If you have sleep apnea, surgery and certain medicines can increase your risk for breathing problems. Follow instructions from your health care provider about wearing your sleep device: Anytime you are sleeping, including during daytime naps. While taking prescription pain medicines, sleeping medicines, or medicines that make you drowsy. Return to your normal activities as told by your health care provider. Ask your health care provider what activities are safe for you. Take over-the-counter and prescription medicines only as told by your health care provider. Do not use any products that contain nicotine or tobacco. These products include cigarettes, chewing tobacco, and vaping devices, such as e-cigarettes. These can delay incision healing after surgery. If you need help quitting, ask your health care provider. Contact a health care provider if: You have nausea or vomiting that does not get  better with medicine. You vomit every time you eat or drink. You have pain that does not get better with medicine. You cannot urinate or have bloody urine. You develop a skin rash. You have a fever. Get help right away if: You have trouble breathing. You have chest pain. You vomit blood. These symptoms may be an emergency. Get help right away. Call 911. Do not wait to see if the symptoms will go away. Do not drive yourself to the hospital. Summary After the procedure, it is common to have a sore throat, hoarseness, nausea, vomiting, or to feel weak, sleepy, or fatigue. For the time period you were told by your health care provider, do not drive or use machinery. Get help right away if you have difficulty breathing, have chest pain, or vomit blood. These symptoms may be an emergency. This information is not intended to replace advice given to you by your health care provider. Make sure you discuss any questions you have with your health care provider. Document Revised: 01/17/2022 Document Reviewed: 01/17/2022 Elsevier Patient Education  2024 [employer].

## 2024-04-21 ENCOUNTER — Encounter (HOSPITAL_COMMUNITY)
Admission: RE | Admit: 2024-04-21 | Discharge: 2024-04-21 | Disposition: A | Discharge status: Home / Self Care | Source: Ambulatory Visit | Attending: Internal Medicine | Admitting: Internal Medicine

## 2024-04-21 ENCOUNTER — Other Ambulatory Visit: Payer: Self-pay

## 2024-04-21 ENCOUNTER — Encounter (HOSPITAL_COMMUNITY): Payer: Self-pay

## 2024-04-21 VITALS — BP 137/66 | HR 74 | Resp 18 | Ht 68.0 in | Wt 177.2 lb

## 2024-04-21 DIAGNOSIS — Z01812 Encounter for preprocedural laboratory examination: Secondary | ICD-10-CM | POA: Diagnosis present

## 2024-04-21 DIAGNOSIS — R7303 Prediabetes: Secondary | ICD-10-CM | POA: Insufficient documentation

## 2024-04-21 LAB — BASIC METABOLIC PANEL WITH GFR
Anion gap: 13 (ref 5–15)
BUN: 19 mg/dL (ref 8–23)
CO2: 20 mmol/L — ABNORMAL LOW (ref 22–32)
Calcium: 8.8 mg/dL — ABNORMAL LOW (ref 8.9–10.3)
Chloride: 103 mmol/L (ref 98–111)
Creatinine, Ser: 1.2 mg/dL (ref 0.61–1.24)
GFR, Estimated: >60 mL/min (ref >60)
Glucose, Bld: 139 mg/dL — ABNORMAL HIGH (ref 70–99)
Potassium: 3.4 mmol/L — ABNORMAL LOW (ref 3.5–5.1)
Sodium: 136 mmol/L (ref 135–145)

## 2024-04-23 ENCOUNTER — Encounter (HOSPITAL_COMMUNITY): Payer: Self-pay | Admitting: Anesthesiology

## 2024-04-25 ENCOUNTER — Other Ambulatory Visit: Payer: Self-pay | Admitting: Family Medicine

## 2024-04-25 ENCOUNTER — Ambulatory Visit (HOSPITAL_COMMUNITY): Admission: RE | Admit: 2024-04-25 | Source: Home / Self Care | Admitting: Internal Medicine

## 2024-04-25 ENCOUNTER — Encounter (HOSPITAL_COMMUNITY): Admission: RE | Payer: Self-pay | Source: Home / Self Care

## 2024-04-25 DIAGNOSIS — I700 Atherosclerosis of aorta: Secondary | ICD-10-CM

## 2024-04-25 DIAGNOSIS — E782 Mixed hyperlipidemia: Secondary | ICD-10-CM

## 2024-04-25 SURGERY — EGD (ESOPHAGOGASTRODUODENOSCOPY)
Anesthesia: Choice

## 2024-05-04 ENCOUNTER — Ambulatory Visit: Payer: Self-pay | Admitting: Family Medicine

## 2024-05-04 ENCOUNTER — Encounter: Payer: Self-pay | Admitting: Family Medicine

## 2024-05-04 ENCOUNTER — Ambulatory Visit (INDEPENDENT_AMBULATORY_CARE_PROVIDER_SITE_OTHER): Admitting: Family Medicine

## 2024-05-04 VITALS — BP 128/74 | HR 82 | Temp 98.2°F | Ht 68.0 in | Wt 172.4 lb

## 2024-05-04 DIAGNOSIS — J449 Chronic obstructive pulmonary disease, unspecified: Secondary | ICD-10-CM

## 2024-05-04 DIAGNOSIS — R7303 Prediabetes: Secondary | ICD-10-CM

## 2024-05-04 DIAGNOSIS — I10 Essential (primary) hypertension: Secondary | ICD-10-CM | POA: Diagnosis not present

## 2024-05-04 DIAGNOSIS — M62830 Muscle spasm of back: Secondary | ICD-10-CM | POA: Diagnosis not present

## 2024-05-04 LAB — BAYER DCA HB A1C WAIVED: HB A1C (BAYER DCA - WAIVED): 5.8 % — ABNORMAL HIGH (ref 4.8–5.6)

## 2024-05-04 MED ORDER — LOSARTAN POTASSIUM-HCTZ 50-12.5 MG PO TABS: 1.0000 | ORAL_TABLET | Freq: Every day | ORAL | 3 refills | Status: AC

## 2024-05-04 MED ORDER — CYCLOBENZAPRINE HCL 5 MG PO TABS: 5.0000 mg | ORAL_TABLET | Freq: Three times a day (TID) | ORAL | 0 refills | Status: DC | PRN

## 2024-05-04 NOTE — Progress Notes (Signed)
 Subjective:  Patient ID: Richard Velez, male    DOB: [birth year], 68 y.o.   MRN: [id number]  Patient Care Team: Severa Rock HERO, FNP as PCP - General (Family Medicine) Lavona Agent, MD as PCP - Cardiology (Cardiology) Shaaron Lamar HERO, MD as Consulting Physician (Gastroenterology)   Chief Complaint:  Medical Management of Chronic Issues (3 month follow up ) and Flank Pain (Right - pain on right flank with certain movements that has been going on a few months )   HPI: Richard Velez is a 68 y.o. male presenting on 05/04/2024 for Medical Management of Chronic Issues (3 month follow up ) and Flank Pain (Right - pain on right flank with certain movements that has been going on a few months )   Richard Velez is a 68 year old male with COPD and esophageal stricture who presents with right-sided rib and back pain.  Right-sided rib and back pain - Pain localized under the right ribs and in the back - Associated with laying his arm on climber's equipment - Exacerbated by reaching into the back seat of a car - Pain subsides but has been persistent for a while - Initially suspected bursitis, but pain is localized rather than widespread - No attempt to press on the area to elicit pain  Dysphagia and esophageal stricture - History of esophageal stricture with two prior dilation procedures - Occasional difficulty with food getting stuck, requiring time to pass - No choking episodes - Currently taking omeprazole  twice daily, which controls symptoms - Heartburn occurs if omeprazole  dose is reduced - Missed recent esophageal dilation appointment due to transportation issues  Chronic obstructive pulmonary disease (copd) symptoms - Uses Trelegy once daily - Uses albuterol  as needed, particularly before bed for wheezing and occasionally before physical activity - Dyspnea limits ability to perform desired activities  Elevated alkaline phosphatase - Recent liver enzyme test showed elevated  alkaline phosphatase - Alkaline phosphatase rechecked today - Uncertain if elevated alkaline phosphatase is related to current pain          Relevant past medical, surgical, family, and social history reviewed and updated as indicated.  Allergies and medications reviewed and updated. Data reviewed: Chart in Epic.   Past Medical History:  Diagnosis Date   Aortic atherosclerosis (HCC) 02/19/2021   Cataract    Emphysema lung (HCC) 02/19/2021   Enlarged prostate 01/10/2021   Essential hypertension 01/10/2021   Gastroesophageal reflux disease 01/10/2021   History of kidney stones    Lung nodules    Benign in appearance on low-dose lung cancer screening CT on 02/19/2021   Mixed hyperlipidemia 01/11/2021   Prediabetes 10/18/2021    Past Surgical History:  Procedure Laterality Date   BIOPSY  02/27/2021   Procedure: BIOPSY;  Surgeon: Shaaron Lamar HERO, MD;  Location: [facility];  Service: Endoscopy;;  gastric esophageal   COLONOSCOPY N/A 02/27/2021   Two 5 to 6 mm polyps at the ileocecal valve (tubular adenomas), examination was otherwise normal.   ESOPHAGOGASTRODUODENOSCOPY N/A 02/27/2021   Distal esophageal stenosis/stricture.-**Dilated**   ESOPHAGOGASTRODUODENOSCOPY (EGD) WITH PROPOFOL  N/A 09/12/2021   Procedure: ESOPHAGOGASTRODUODENOSCOPY (EGD) WITH PROPOFOL ;  Surgeon: Shaaron Lamar HERO, MD;  Location: [facility];  Service: Endoscopy;  Laterality: N/A;  2:45pm   EYE SURGERY Bilateral    cataract   MALONEY DILATION N/A 02/27/2021   Procedure: MALONEY DILATION;  Surgeon: Shaaron Lamar HERO, MD;  Location: [facility];  Service: Endoscopy;  Laterality: N/A;   MALONEY DILATION N/A 09/12/2021  Procedure: MALONEY DILATION;  Surgeon: Shaaron Lamar HERO, MD;  Location: [facility];  Service: Endoscopy;  Laterality: N/A;   POLYPECTOMY  02/27/2021   Procedure: POLYPECTOMY;  Surgeon: Shaaron Lamar HERO, MD;  Location: [facility];  Service: Endoscopy;;  colon   WRIST SURGERY       Social History   Socioeconomic History   Marital status: Married    Spouse name: Arland   Number of children: Not on file   Years of education: Not on file   Highest education level: Not on file  Occupational History   Occupation: Retired    Comment: Delivered ice  Tobacco Use   Smoking status: Every Day    Current packs/day: 1.00    Average packs/day: 1 pack/day for 53.3 years (53.3 ttl pk-yrs)    Types: Cigarettes    Start date: 01/11/1971   Smokeless tobacco: Never  Vaping Use   Vaping status: Never Used  Substance and Sexual Activity   Alcohol use: Yes    Comment: occ   Drug use: Never   Sexual activity: Not on file  Other Topics Concern   Not on file  Social History Narrative   One child.  Lives with wife.  He used to deliver ice.     Social Drivers of Corporate investment banker Strain: Low Risk  (10/14/2023)   Overall Financial Resource Strain (CARDIA)    Difficulty of Paying Living Expenses: Not hard at all  Food Insecurity: No Food Insecurity (10/14/2023)   Hunger Vital Sign    Worried About Running Out of Food in the Last Year: Never true    Ran Out of Food in the Last Year: Never true  Transportation Needs: No Transportation Needs (10/14/2023)   PRAPARE - Administrator, Civil Service (Medical): No    Lack of Transportation (Non-Medical): No  Physical Activity: Insufficiently Active (10/14/2023)   Exercise Vital Sign    Days of Exercise per Week: 3 days    Minutes of Exercise per Session: 30 min  Stress: No Stress Concern Present (10/14/2023)   [employer] of Occupational Health - Occupational Stress Questionnaire    Feeling of Stress : Not at all  Social Connections: Moderately Isolated (10/14/2023)   Social Connection and Isolation Panel    Frequency of Communication with Friends and Family: More than three times a week    Frequency of Social Gatherings with Friends and Family: Three times a week    Attends Religious Services:  Never    Active Member of Clubs or Organizations: No    Attends Banker Meetings: Never    Marital Status: Married  Catering manager Violence: Not At Risk (10/14/2023)   Humiliation, Afraid, Rape, and Kick questionnaire    Fear of Current or Ex-Partner: No    Emotionally Abused: No    Physically Abused: No    Sexually Abused: No    Outpatient Encounter Medications as of 05/04/2024  Medication Sig   albuterol  (VENTOLIN  HFA) 108 (90 Base) MCG/ACT inhaler Inhale 2 puffs into the lungs every 6 (six) hours as needed for wheezing or shortness of breath.   aspirin  EC 81 MG tablet Take 1 tablet (81 mg total) by mouth daily. Swallow whole.   atorvastatin  (LIPITOR) 10 MG tablet TAKE ONE TABLET ONCE DAILY   cyclobenzaprine (FLEXERIL) 5 MG tablet Take 1 tablet (5 mg total) by mouth 3 (three) times daily as needed for muscle spasms.   magnesium gluconate (MAGONATE) 500 (27  Mg) MG TABS tablet Take 500 mg by mouth in the morning and at bedtime.   omeprazole  (PRILOSEC) 40 MG capsule TAKE ONE CAPSULE TWICE DAILY   TRELEGY ELLIPTA  100-62.5-25 MCG/ACT AEPB INHALE ONE PUFF DAILY   Vitamin D , Ergocalciferol , (DRISDOL ) 1.25 MG (50000 UNIT) CAPS capsule Take 1 capsule (50,000 Units total) by mouth every 7 (seven) days.   [DISCONTINUED] losartan -hydrochlorothiazide  (HYZAAR) 50-12.5 MG tablet Take 1 tablet by mouth daily.   losartan -hydrochlorothiazide  (HYZAAR) 50-12.5 MG tablet Take 1 tablet by mouth daily.   No facility-administered encounter medications on file as of 05/04/2024.    Allergies  Allergen Reactions   Lisinopril  Cough   Oxycodone-Acetaminophen Itching and Nausea Only    Pertinent ROS per HPI, otherwise unremarkable      Objective:  BP 128/74   Pulse 82   Temp 98.2 F (36.8 C)   Ht 5' 8 (1.727 m)   Wt 172 lb 6.4 oz (78.2 kg)   SpO2 96%   BMI 26.21 kg/m    Wt Readings from Last 3 Encounters:  05/04/24 172 lb 6.4 oz (78.2 kg)  04/21/24 177 lb 3.2 oz (80.4 kg)   03/31/24 177 lb 3.2 oz (80.4 kg)    Physical Exam Vitals and nursing note reviewed.  Constitutional:      Appearance: Normal appearance. He is overweight.  HENT:     Head: Normocephalic and atraumatic.     Nose: Nose normal.     Mouth/Throat:     Mouth: Mucous membranes are moist.  Eyes:     Conjunctiva/sclera: Conjunctivae normal.     Pupils: Pupils are equal, round, and reactive to light.  Cardiovascular:     Rate and Rhythm: Normal rate and regular rhythm.     Pulses: Normal pulses.     Heart sounds: Normal heart sounds.  Pulmonary:     Breath sounds: Rhonchi (clears with cough) present.  Musculoskeletal:       Arms:     Cervical back: Neck supple.  Skin:    General: Skin is warm and dry.     Capillary Refill: Capillary refill takes less than 2 seconds.  Neurological:     General: No focal deficit present.     Mental Status: He is alert and oriented to person, place, and time.  Psychiatric:        Mood and Affect: Mood normal.        Behavior: Behavior normal. Behavior is cooperative.        Thought Content: Thought content normal.        Judgment: Judgment normal.    Physical Exam   MUSCULOSKELETAL: Palpable tightness in the right paraspinous muscle, approximately the size of an egg.        Results for orders placed or performed during the hospital encounter of 04/21/24  Basic metabolic panel   Collection Time: 04/21/24 11:50 AM  Result Value Ref Range   Sodium 136 135 - 145 mmol/L   Potassium 3.4 (L) 3.5 - 5.1 mmol/L   Chloride 103 98 - 111 mmol/L   CO2 20 (L) 22 - 32 mmol/L   Glucose, Bld 139 (H) 70 - 99 mg/dL   BUN 19 8 - 23 mg/dL   Creatinine, Ser 8.79 0.61 - 1.24 mg/dL   Calcium  8.8 (L) 8.9 - 10.3 mg/dL   GFR, Estimated >39 >39 mL/min   Anion gap 13 5 - 15       Pertinent labs & imaging results that were available during my care  of the patient were reviewed by me and considered in my medical decision making.  Assessment & Plan:  Richard Velez was seen  today for medical management of chronic issues and flank pain.  Diagnoses and all orders for this visit:  Prediabetes -     Bayer DCA Hb A1c Waived  Essential hypertension -     losartan -hydrochlorothiazide  (HYZAAR) 50-12.5 MG tablet; Take 1 tablet by mouth daily.  COPD without exacerbation (HCC) Well controlled   Spasm of paraspinal muscle -     cyclobenzaprine (FLEXERIL) 5 MG tablet; Take 1 tablet (5 mg total) by mouth 3 (three) times daily as needed for muscle spasms.      Muscle strain Pain under the right ribs and back, likely due to a muscle strain. Palpable tight muscle, about the size of an egg, indicating a muscle spasm. Pain exacerbated by movements such as reaching into the back seat of a car. Suspected strain of the paraspinous muscles. - Prescribe Flexeril for muscle relaxation, with caution for potential drowsiness - Advise use of heat therapy on the affected area - Instruct to report if symptoms do not improve  Esophageal stricture Esophageal stricture requiring dilation. Experiences food impaction and occasional heartburn. On omeprazole  twice daily, which helps manage symptoms, but still experiences some heartburn. Missed a scheduled dilation due to transportation issues and needs to reschedule. Continued use of PPIs is crucial to prevent further strictures. Future dilation will likely include a biopsy to rule out other issues. - Continue omeprazole  twice daily - Reschedule esophageal dilation - Advise to continue taking PPIs to prevent further strictures  Chronic Obstructive Pulmonary Disease (COPD) COPD with shortness of breath limiting activities. Uses Trelegy daily and albuterol  as needed, particularly before bed and during physical exertion. Albuterol  use before physical exertion, including sexual activity, is appropriate to manage symptoms. - Continue Trelegy once daily - Use albuterol  as needed, especially before bed and physical exertion  Hypertension On  losartan  hydrochlorothiazide  for hypertension. Recently refilled prescription and reports no issues with the medication. - Continue losartan  hydrochlorothiazide  as prescribed  Liver enzyme elevation Previously elevated alkaline phosphatase level. Recent blood work done to recheck liver enzymes, including alkaline phosphatase and GGT, to determine if the elevation persists. Awaiting results to guide further management. - Await results of recent liver enzyme tests  General Health Maintenance A1c is 5.8, indicating good glycemic control. Lung cancer screening was normal. Under regular surveillance for health conditions. - Continue regular follow-ups and screenings as needed        Continue all other maintenance medications.  Follow up plan: Return in about 6 months (around 11/04/2024), or if symptoms worsen or fail to improve, for chronic follow up .   Continue healthy lifestyle choices, including diet (rich in fruits, vegetables, and lean proteins, and low in salt and simple carbohydrates) and exercise (at least 30 minutes of moderate physical activity daily).  Educational handout given for prediabetes  The above assessment and management plan was discussed with the patient. The patient verbalized understanding of and has agreed to the management plan. Patient is aware to call the clinic if they develop any new symptoms or if symptoms persist or worsen. Patient is aware when to return to the clinic for a follow-up visit. Patient educated on when it is appropriate to go to the emergency department.   Rosaline Bruns, FNP-C [facility] [phone]

## 2024-05-08 LAB — ALKALINE PHOSPHATASE, ISOENZYMES
BONE FRACTION: 52 % (ref 12–68)
INTESTINAL FRAC.: 3 % (ref 0–18)
LIVER FRACTION: 45 % (ref 13–88)

## 2024-05-08 LAB — HEPATIC FUNCTION PANEL
ALT: 19 IU/L (ref 0–44)
AST: 14 IU/L (ref 0–40)
Albumin: 4.3 g/dL (ref 3.9–4.9)
Alkaline Phosphatase: 119 IU/L (ref 44–121)
Bilirubin Total: 0.9 mg/dL (ref 0.0–1.2)
Bilirubin, Direct: 0.29 mg/dL (ref 0.00–0.40)
Total Protein: 6.4 g/dL (ref 6.0–8.5)

## 2024-05-08 LAB — GAMMA GT: GGT: 17 IU/L (ref 0–65)

## 2024-05-08 LAB — MITOCHONDRIAL ANTIBODIES: Mitochondrial Ab: <20 U (ref 0.0–20.0)

## 2024-05-11 ENCOUNTER — Ambulatory Visit: Payer: Self-pay | Admitting: Gastroenterology

## 2024-05-16 ENCOUNTER — Other Ambulatory Visit: Payer: Self-pay | Admitting: Family Medicine

## 2024-05-16 DIAGNOSIS — J439 Emphysema, unspecified: Secondary | ICD-10-CM

## 2024-05-26 ENCOUNTER — Other Ambulatory Visit: Payer: Self-pay | Admitting: Family Medicine

## 2024-05-26 DIAGNOSIS — M62830 Muscle spasm of back: Secondary | ICD-10-CM

## 2024-07-13 ENCOUNTER — Other Ambulatory Visit: Payer: Self-pay | Admitting: Family Medicine

## 2024-07-13 DIAGNOSIS — I700 Atherosclerosis of aorta: Secondary | ICD-10-CM

## 2024-07-13 DIAGNOSIS — E782 Mixed hyperlipidemia: Secondary | ICD-10-CM

## 2024-07-13 DIAGNOSIS — E559 Vitamin D deficiency, unspecified: Secondary | ICD-10-CM

## 2024-07-20 ENCOUNTER — Other Ambulatory Visit: Payer: Self-pay | Admitting: Family Medicine

## 2024-07-20 DIAGNOSIS — M62830 Muscle spasm of back: Secondary | ICD-10-CM

## 2024-09-09 ENCOUNTER — Other Ambulatory Visit: Payer: Self-pay | Admitting: *Deleted

## 2024-09-09 DIAGNOSIS — J439 Emphysema, unspecified: Secondary | ICD-10-CM

## 2024-09-09 DIAGNOSIS — M62830 Muscle spasm of back: Secondary | ICD-10-CM

## 2024-11-09 ENCOUNTER — Encounter: Payer: Self-pay | Admitting: Family Medicine

## 2024-11-09 ENCOUNTER — Ambulatory Visit (INDEPENDENT_AMBULATORY_CARE_PROVIDER_SITE_OTHER): Payer: Self-pay | Admitting: Family Medicine

## 2024-11-09 ENCOUNTER — Other Ambulatory Visit: Payer: Self-pay | Admitting: Family Medicine

## 2024-11-09 ENCOUNTER — Ambulatory Visit: Payer: Self-pay | Admitting: Family Medicine

## 2024-11-09 VITALS — BP 123/73 | HR 77 | Temp 98.2°F | Ht 68.0 in | Wt 179.0 lb

## 2024-11-09 DIAGNOSIS — R7303 Prediabetes: Secondary | ICD-10-CM | POA: Diagnosis not present

## 2024-11-09 DIAGNOSIS — J449 Chronic obstructive pulmonary disease, unspecified: Secondary | ICD-10-CM | POA: Diagnosis not present

## 2024-11-09 DIAGNOSIS — D692 Other nonthrombocytopenic purpura: Secondary | ICD-10-CM

## 2024-11-09 DIAGNOSIS — F5104 Psychophysiologic insomnia: Secondary | ICD-10-CM | POA: Diagnosis not present

## 2024-11-09 DIAGNOSIS — N400 Enlarged prostate without lower urinary tract symptoms: Secondary | ICD-10-CM

## 2024-11-09 DIAGNOSIS — N289 Disorder of kidney and ureter, unspecified: Secondary | ICD-10-CM

## 2024-11-09 DIAGNOSIS — E782 Mixed hyperlipidemia: Secondary | ICD-10-CM | POA: Diagnosis not present

## 2024-11-09 DIAGNOSIS — I700 Atherosclerosis of aorta: Secondary | ICD-10-CM

## 2024-11-09 DIAGNOSIS — I10 Essential (primary) hypertension: Secondary | ICD-10-CM | POA: Diagnosis not present

## 2024-11-09 DIAGNOSIS — M62830 Muscle spasm of back: Secondary | ICD-10-CM | POA: Diagnosis not present

## 2024-11-09 DIAGNOSIS — J439 Emphysema, unspecified: Secondary | ICD-10-CM

## 2024-11-09 DIAGNOSIS — K219 Gastro-esophageal reflux disease without esophagitis: Secondary | ICD-10-CM | POA: Diagnosis not present

## 2024-11-09 DIAGNOSIS — H00015 Hordeolum externum left lower eyelid: Secondary | ICD-10-CM | POA: Diagnosis not present

## 2024-11-09 LAB — BAYER DCA HB A1C WAIVED: HB A1C (BAYER DCA - WAIVED): 5.5 % (ref 4.8–5.6)

## 2024-11-09 MED ORDER — HYDROXYZINE PAMOATE 25 MG PO CAPS: 25.0000 mg | ORAL_CAPSULE | Freq: Three times a day (TID) | ORAL | 0 refills | Status: AC | PRN

## 2024-11-09 MED ORDER — CYCLOBENZAPRINE HCL 5 MG PO TABS: 5.0000 mg | ORAL_TABLET | Freq: Three times a day (TID) | ORAL | 3 refills | Status: AC

## 2024-11-09 MED ORDER — POLYMYXIN B-TRIMETHOPRIM 10000-0.1 UNIT/ML-% OP SOLN: 1.0000 [drp] | Freq: Four times a day (QID) | OPHTHALMIC | 0 refills | Status: AC

## 2024-11-09 NOTE — Progress Notes (Signed)
 "    Subjective:  Patient ID: Richard Velez, male    DOB: 22-May-1956, 69 y.o.   MRN: [id number]  Patient Care Team: Severa Rock HERO, FNP as PCP - General (Family Medicine) Lavona Agent, MD as PCP - Cardiology (Cardiology) Shaaron Lamar HERO, MD as Consulting Physician (Gastroenterology)   Chief Complaint:  Medical Management of Chronic Issues (6 month follow up )   HPI: Richard Velez is a 69 y.o. male presenting on 11/09/2024 for Medical Management of Chronic Issues (6 month follow up )   Richard Velez is a 69 year old male with anxiety and insomnia who presents with difficulty sleeping and anxiety management.  He experiences significant anxiety and insomnia, particularly after the loss of two siblings in the past year. He often lies awake until after 4 AM and uses Xanax, provided by his sister, taking half a tablet at night to aid sleep. Without it, he struggles to fall asleep and feels restless, impacting his daily functioning.  He has a history of COPD and uses Trelegy, which he finds effective. He also uses an albuterol  inhaler at night to manage wheezing that disrupts his sleep. Insurance issues have prevented him from using Breztri.  He experiences muscle spasms and cramps, particularly in his feet, and uses Flexeril  at night to manage these symptoms. Despite taking magnesium twice daily, he continues to experience cramping. He recalls a past blood disorder in childhood that caused easy bruising, which he relates to his current tendency to bruise easily.  He has a history of acid reflux and was scheduled for an esophageal dilation, which he missed due to transportation issues. He experiences phlegm production at night, especially after consuming dairy, which sometimes causes a choking sensation.  He has a sty on his left lower eyelid that developed a whitehead two days ago.         11/09/2024    2:43 PM 05/04/2024    2:43 PM 02/03/2024    2:42 PM 11/05/2023    3:18 PM 10/14/2023    1:50  PM  Depression screen PHQ 2/9  Decreased Interest 0 0 0 0 0  Down, Depressed, Hopeless 3 0 0 0 0  PHQ - 2 Score 3 0 0 0 0  Altered sleeping 0 0 0  0  Tired, decreased energy 0 0 0  0  Change in appetite 0 0 0  0  Feeling bad or failure about yourself  0 0 0  0  Trouble concentrating 0 0 0  0  Moving slowly or fidgety/restless 0 0 0  0  Suicidal thoughts 0 0 0  0  PHQ-9 Score 3 0  0   0   Difficult doing work/chores Not difficult at all Not difficult at all Not difficult at all  Not difficult at all     Data saved with a previous flowsheet row definition      11/09/2024    2:43 PM 05/04/2024    2:43 PM 05/05/2023    2:18 PM 04/01/2023    2:16 PM  GAD 7 : Generalized Anxiety Score  Nervous, Anxious, on Edge 0 0 0 0  Control/stop worrying 0 0 0 0  Worry too much - different things 0 0 0 0  Trouble relaxing 0 0 0 1  Restless 0 0 0 0  Easily annoyed or irritable 0 0 0 0  Afraid - awful might happen 0 0 0 0  Total GAD 7 Score 0 0 0 1  Anxiety Difficulty  Not difficult at all Not difficult at all Not difficult at all Not difficult at all        Relevant past medical, surgical, family, and social history reviewed and updated as indicated.  Allergies and medications reviewed and updated. Data reviewed: Chart in Epic.   Past Medical History:  Diagnosis Date   Aortic atherosclerosis 02/19/2021   Cataract    Emphysema lung (HCC) 02/19/2021   Enlarged prostate 01/10/2021   Essential hypertension 01/10/2021   Gastroesophageal reflux disease 01/10/2021   History of kidney stones    Lung nodules    Benign in appearance on low-dose lung cancer screening CT on 02/19/2021   Mixed hyperlipidemia 01/11/2021   Prediabetes 10/18/2021    Past Surgical History:  Procedure Laterality Date   BIOPSY  02/27/2021   Procedure: BIOPSY;  Surgeon: Shaaron Lamar HERO, MD;  Location: [facility];  Service: Endoscopy;;  gastric esophageal   COLONOSCOPY N/A 02/27/2021   Two 5 to 6 mm polyps at the  ileocecal valve (tubular adenomas), examination was otherwise normal.   ESOPHAGOGASTRODUODENOSCOPY N/A 02/27/2021   Distal esophageal stenosis/stricture.-**Dilated**   ESOPHAGOGASTRODUODENOSCOPY (EGD) WITH PROPOFOL  N/A 09/12/2021   Procedure: ESOPHAGOGASTRODUODENOSCOPY (EGD) WITH PROPOFOL ;  Surgeon: Shaaron Lamar HERO, MD;  Location: [facility];  Service: Endoscopy;  Laterality: N/A;  2:45pm   EYE SURGERY Bilateral    cataract   MALONEY DILATION N/A 02/27/2021   Procedure: MALONEY DILATION;  Surgeon: Shaaron Lamar HERO, MD;  Location: [facility];  Service: Endoscopy;  Laterality: N/A;   MALONEY DILATION N/A 09/12/2021   Procedure: AGAPITO DILATION;  Surgeon: Shaaron Lamar HERO, MD;  Location: [facility];  Service: Endoscopy;  Laterality: N/A;   POLYPECTOMY  02/27/2021   Procedure: POLYPECTOMY;  Surgeon: Shaaron Lamar HERO, MD;  Location: [facility];  Service: Endoscopy;;  colon   WRIST SURGERY      Social History   Socioeconomic History   Marital status: Married    Spouse name: Arland   Number of children: Not on file   Years of education: Not on file   Highest education level: Not on file  Occupational History   Occupation: Retired    Comment: Delivered ice  Tobacco Use   Smoking status: Every Day    Current packs/day: 1.00    Average packs/day: 1 pack/day for 53.8 years (53.8 ttl pk-yrs)    Types: Cigarettes    Start date: 01/11/1971   Smokeless tobacco: Never  Vaping Use   Vaping status: Never Used  Substance and Sexual Activity   Alcohol use: Yes    Comment: occ   Drug use: Never   Sexual activity: Not on file  Other Topics Concern   Not on file  Social History Narrative   One child.  Lives with wife.  He used to deliver ice.     Social Drivers of Health   Tobacco Use: High Risk (11/09/2024)   Patient History    Smoking Tobacco Use: Every Day    Smokeless Tobacco Use: Never    Passive Exposure: Not on file  Financial Resource Strain: Low Risk (10/14/2023)    Overall Financial Resource Strain (CARDIA)    Difficulty of Paying Living Expenses: Not hard at all  Food Insecurity: No Food Insecurity (10/14/2023)   Hunger Vital Sign    Worried About Running Out of Food in the Last Year: Never true    Ran Out of Food in the Last Year: Never true  Transportation Needs: No Transportation Needs (  10/14/2023)   PRAPARE - Administrator, Civil Service (Medical): No    Lack of Transportation (Non-Medical): No  Physical Activity: Insufficiently Active (10/14/2023)   Exercise Vital Sign    Days of Exercise per Week: 3 days    Minutes of Exercise per Session: 30 min  Stress: No Stress Concern Present (10/14/2023)   [employer] of Occupational Health - Occupational Stress Questionnaire    Feeling of Stress : Not at all  Social Connections: Moderately Isolated (10/14/2023)   Social Connection and Isolation Panel    Frequency of Communication with Friends and Family: More than three times a week    Frequency of Social Gatherings with Friends and Family: Three times a week    Attends Religious Services: Never    Active Member of Clubs or Organizations: No    Attends Banker Meetings: Never    Marital Status: Married  Catering Manager Violence: Not At Risk (10/14/2023)   Humiliation, Afraid, Rape, and Kick questionnaire    Fear of Current or Ex-Partner: No    Emotionally Abused: No    Physically Abused: No    Sexually Abused: No  Depression (PHQ2-9): Low Risk (11/09/2024)   Depression (PHQ2-9)    PHQ-2 Score: 3  Alcohol Screen: Low Risk (10/14/2023)   Alcohol Screen    Last Alcohol Screening Score (AUDIT): 0  Housing: Low Risk (10/14/2023)   Housing    Last Housing Risk Score: 0  Utilities: Not At Risk (10/14/2023)   AHC Utilities    Threatened with loss of utilities: No  Health Literacy: Adequate Health Literacy (10/14/2023)   B1300 Health Literacy    Frequency of need for help with medical instructions: Never     Outpatient Encounter Medications as of 11/09/2024  Medication Sig   albuterol  (VENTOLIN  HFA) 108 (90 Base) MCG/ACT inhaler Inhale 2 puffs into the lungs every 6 (six) hours as needed for wheezing or shortness of breath.   aspirin  EC 81 MG tablet Take 1 tablet (81 mg total) by mouth daily. Swallow whole.   atorvastatin  (LIPITOR) 10 MG tablet TAKE ONE TABLET ONCE DAILY   Fluticasone-Umeclidin-Vilant (TRELEGY ELLIPTA ) 100-62.5-25 MCG/ACT AEPB INHALE ONE PUFF DAILY   hydrOXYzine  (VISTARIL ) 25 MG capsule Take 1-2 capsules (25-50 mg total) by mouth every 8 (eight) hours as needed (insomnia).   losartan -hydrochlorothiazide  (HYZAAR) 50-12.5 MG tablet Take 1 tablet by mouth daily.   magnesium gluconate (MAGONATE) 500 (27 Mg) MG TABS tablet Take 500 mg by mouth in the morning and at bedtime.   omeprazole  (PRILOSEC) 40 MG capsule TAKE ONE CAPSULE TWICE DAILY   trimethoprim -polymyxin b  (POLYTRIM ) ophthalmic solution Place 1 drop into both eyes in the morning, at noon, in the evening, and at bedtime for 7 days.   Vitamin D , Ergocalciferol , (DRISDOL ) 1.25 MG (50000 UNIT) CAPS capsule TAKE 1 CAPSULE EVERY 7 DAYS   [DISCONTINUED] cyclobenzaprine  (FLEXERIL ) 5 MG tablet TAKE ONE TABLET UP TO THREE TIMES DAILY AS NEEDED FOR MUSCLE SPASMS   cyclobenzaprine  (FLEXERIL ) 5 MG tablet Take 1 tablet (5 mg total) by mouth 3 (three) times daily.   No facility-administered encounter medications on file as of 11/09/2024.    Allergies[1]  Pertinent ROS per HPI, otherwise unremarkable      Objective:  BP 123/73   Pulse 77   Temp 98.2 F (36.8 C)   Ht 5' 8 (1.727 m)   Wt 179 lb (81.2 kg)   SpO2 96%   BMI 27.22 kg/m    Wt  Readings from Last 3 Encounters:  11/09/24 179 lb (81.2 kg)  05/04/24 172 lb 6.4 oz (78.2 kg)  04/21/24 177 lb 3.2 oz (80.4 kg)    Physical Exam Vitals and nursing note reviewed.  Constitutional:      General: He is not in acute distress.    Appearance: Normal appearance. He is  overweight. He is not ill-appearing, toxic-appearing or diaphoretic.  HENT:     Head: Normocephalic and atraumatic.     Nose: Nose normal.     Mouth/Throat:     Mouth: Mucous membranes are moist.  Eyes:     General:        Left eye: Hordeolum present.    Conjunctiva/sclera: Conjunctivae normal.     Pupils: Pupils are equal, round, and reactive to light.   Cardiovascular:     Rate and Rhythm: Normal rate and regular rhythm.     Heart sounds: Normal heart sounds.  Pulmonary:     Effort: Pulmonary effort is normal.     Breath sounds: Normal breath sounds.  Abdominal:     General: Bowel sounds are normal.     Palpations: Abdomen is soft.  Musculoskeletal:     Cervical back: Neck supple.     Right lower leg: No edema.     Left lower leg: No edema.  Skin:    General: Skin is warm and dry.     Capillary Refill: Capillary refill takes less than 2 seconds.  Neurological:     General: No focal deficit present.     Mental Status: He is alert and oriented to person, place, and time.  Psychiatric:        Mood and Affect: Mood normal.        Behavior: Behavior normal. Behavior is cooperative.        Thought Content: Thought content normal.        Judgment: Judgment normal.       Results for orders placed or performed in visit on 05/04/24  Bayer DCA Hb A1c Waived   Collection Time: 05/04/24  2:29 PM  Result Value Ref Range   HB A1C (BAYER DCA - WAIVED) 5.8 (H) 4.8 - 5.6 %       Pertinent labs & imaging results that were available during my care of the patient were reviewed by me and considered in my medical decision making.  Assessment & Plan:  Shantel was seen today for medical management of chronic issues.  Diagnoses and all orders for this visit:  Essential hypertension -     Bayer DCA Hb A1c Waived -     CMP14+EGFR -     Lipid panel -     CBC with Differential/Platelet -     TSH -     T4, free  Aortic atherosclerosis -     CMP14+EGFR -     Lipid panel  COPD  without exacerbation (HCC) -     CBC with Differential/Platelet  Enlarged prostate -     PSA, total and free  Gastroesophageal reflux disease without esophagitis -     CBC with Differential/Platelet  Mixed hyperlipidemia -     CMP14+EGFR -     Lipid panel -     TSH -     T4, free  Prediabetes -     Bayer DCA Hb A1c Waived -     CMP14+EGFR -     Lipid panel -     CBC with Differential/Platelet  Senile purpura  Psychophysiological  insomnia -     hydrOXYzine  (VISTARIL ) 25 MG capsule; Take 1-2 capsules (25-50 mg total) by mouth every 8 (eight) hours as needed (insomnia).  Spasm of paraspinal muscle -     cyclobenzaprine  (FLEXERIL ) 5 MG tablet; Take 1 tablet (5 mg total) by mouth 3 (three) times daily.  Hordeolum externum of left lower eyelid -     trimethoprim -polymyxin b  (POLYTRIM ) ophthalmic solution; Place 1 drop into both eyes in the morning, at noon, in the evening, and at bedtime for 7 days.        Psychophysiologic insomnia with anxiety Chronic insomnia with associated anxiety, exacerbated by recent family losses. Currently using Xanax intermittently for sleep, but experiencing difficulty falling asleep after 4 AM. Previous medication for anxiety affected sexual function, leading to discontinuation. Hydroxyzine  is considered as a less addictive alternative to Xanax for managing insomnia and anxiety. - Prescribed hydroxyzine  25 mg capsules. Instructed to take one capsule at bedtime and a second if not asleep within an hour. Maximum dose is 100 mg. - Advised to use hydroxyzine  during the day for significant anxiety if needed, but not recommended unless necessary.  Chronic obstructive pulmonary disease COPD is well-managed with Trelegy. Uses albuterol  inhaler at night due to wheezing when lying down. Insurance does not cover Breztri, which would allow for twice-daily dosing before bed. - Continue Trelegy as prescribed. - Use albuterol  inhaler at night as needed for  wheezing.  Spasm of paraspinal muscle Experiencing muscle spasms, particularly at night, leading to cramping. Currently using Flexeril  once nightly due to limited supply. Magnesium supplementation is ongoing. Korene is suggested for muscle spasms and cramps. - Prescribed Flexeril  with 90 pills, to be taken as needed for muscle spasms. - Recommended Nervive for muscle spasms and cramps.  Hordeolum externum of left lower eyelid Sty on the left lower eyelid for nearly a week, with a whitehead forming two days ago. Advised against popping it. Warm compresses and antibiotic drops are recommended for treatment. - Prescribed Polytrim  antibiotic eye drops, to be used four times a day for seven days. - Advised warm compresses to aid in resolution.  Essential hypertension Blood pressure is well-controlled with current medication regimen. No reported side effects such as cough or leg swelling. - Continue current antihypertensive medications.  Prediabetes Previously in the prediabetic range. - Ordered A1c test to monitor glucose levels.        I spent 40 minutes dedicated to the care of this patient on the date of this encounter to include pre-visit review of records including diagnostic studies and labs, face-to-face time with the patient discussing above conditions, post visit ordering of testing, clinical documentation in the electronic health record, making appropriate referrals as documented, and communicating necessary information to the patient's healthcare team.    Continue all other maintenance medications.  Follow up plan: Return in 6 months (on 05/09/2025), or if symptoms worsen or fail to improve, for Annual Physical.   Continue healthy lifestyle choices, including diet (rich in fruits, vegetables, and lean proteins, and low in salt and simple carbohydrates) and exercise (at least 30 minutes of moderate physical activity daily).    The above assessment and management plan was  discussed with the patient. The patient verbalized understanding of and has agreed to the management plan. Patient is aware to call the clinic if they develop any new symptoms or if symptoms persist or worsen. Patient is aware when to return to the clinic for a follow-up visit. Patient educated on when it is  appropriate to go to the emergency department.   Rosaline Bruns, FNP-C [facility] [phone]     [1]  Allergies Allergen Reactions   Lisinopril  Cough   Oxycodone-Acetaminophen Itching and Nausea Only   "

## 2024-11-09 NOTE — Patient Instructions (Signed)
 Korene

## 2024-11-10 LAB — CBC WITH DIFFERENTIAL/PLATELET
Basophils Absolute: 0.1 x10E3/uL (ref 0.0–0.2)
Basos: 2 %
EOS (ABSOLUTE): 0.2 x10E3/uL (ref 0.0–0.4)
Eos: 2 %
Hematocrit: 50.4 % (ref 37.5–51.0)
Hemoglobin: 17.7 g/dL (ref 13.0–17.7)
Immature Grans (Abs): 0 x10E3/uL (ref 0.0–0.1)
Immature Granulocytes: 0 %
Lymphocytes Absolute: 2.6 x10E3/uL (ref 0.7–3.1)
Lymphs: 27 %
MCH: 32.2 pg (ref 26.6–33.0)
MCHC: 35.1 g/dL (ref 31.5–35.7)
MCV: 92 fL (ref 79–97)
Monocytes Absolute: 0.7 x10E3/uL (ref 0.1–0.9)
Monocytes: 8 %
Neutrophils Absolute: 5.8 x10E3/uL (ref 1.4–7.0)
Neutrophils: 61 %
Platelets: 305 x10E3/uL (ref 150–450)
RBC: 5.5 x10E6/uL (ref 4.14–5.80)
RDW: 12.6 % (ref 11.6–15.4)
WBC: 9.5 x10E3/uL (ref 3.4–10.8)

## 2024-11-10 LAB — LIPID PANEL
Chol/HDL Ratio: 3.2 ratio (ref 0.0–5.0)
Cholesterol, Total: 112 mg/dL (ref 100–199)
HDL: 35 mg/dL — ABNORMAL LOW
LDL Chol Calc (NIH): 54 mg/dL (ref 0–99)
Triglycerides: 127 mg/dL (ref 0–149)
VLDL Cholesterol Cal: 23 mg/dL (ref 5–40)

## 2024-11-10 LAB — CMP14+EGFR
ALT: 24 IU/L (ref 0–44)
AST: 15 IU/L (ref 0–40)
Albumin: 4.4 g/dL (ref 3.9–4.9)
Alkaline Phosphatase: 135 IU/L — ABNORMAL HIGH (ref 47–123)
BUN/Creatinine Ratio: 15 (ref 10–24)
BUN: 21 mg/dL (ref 8–27)
Bilirubin Total: 0.7 mg/dL (ref 0.0–1.2)
CO2: 25 mmol/L (ref 20–29)
Calcium: 9.8 mg/dL (ref 8.6–10.2)
Chloride: 100 mmol/L (ref 96–106)
Creatinine, Ser: 1.43 mg/dL — ABNORMAL HIGH (ref 0.76–1.27)
Globulin, Total: 2.1 g/dL (ref 1.5–4.5)
Glucose: 152 mg/dL — ABNORMAL HIGH (ref 70–99)
Potassium: 4.2 mmol/L (ref 3.5–5.2)
Sodium: 140 mmol/L (ref 134–144)
Total Protein: 6.5 g/dL (ref 6.0–8.5)
eGFR: 53 mL/min/1.73 — ABNORMAL LOW

## 2024-11-10 LAB — T4, FREE: Free T4: 1.61 ng/dL (ref 0.82–1.77)

## 2024-11-10 LAB — PSA, TOTAL AND FREE
PSA, Free Pct: 28.1 %
PSA, Free: 0.45 ng/mL
Prostate Specific Ag, Serum: 1.6 ng/mL (ref 0.0–4.0)

## 2024-11-10 LAB — TSH: TSH: 1.22 u[IU]/mL (ref 0.450–4.500)

## 2024-11-11 ENCOUNTER — Other Ambulatory Visit: Payer: Self-pay

## 2024-11-11 DIAGNOSIS — Z87891 Personal history of nicotine dependence: Secondary | ICD-10-CM

## 2024-11-11 DIAGNOSIS — Z122 Encounter for screening for malignant neoplasm of respiratory organs: Secondary | ICD-10-CM

## 2024-11-11 DIAGNOSIS — F17210 Nicotine dependence, cigarettes, uncomplicated: Secondary | ICD-10-CM

## 2024-11-25 ENCOUNTER — Other Ambulatory Visit

## 2024-11-25 DIAGNOSIS — N289 Disorder of kidney and ureter, unspecified: Secondary | ICD-10-CM

## 2024-11-26 LAB — CMP14+EGFR
ALT: 22 [IU]/L (ref 0–44)
AST: 12 [IU]/L (ref 0–40)
Albumin: 4.2 g/dL (ref 3.9–4.9)
Alkaline Phosphatase: 133 [IU]/L — ABNORMAL HIGH (ref 47–123)
BUN/Creatinine Ratio: 13 (ref 10–24)
BUN: 16 mg/dL (ref 8–27)
Bilirubin Total: 0.6 mg/dL (ref 0.0–1.2)
CO2: 23 mmol/L (ref 20–29)
Calcium: 9.5 mg/dL (ref 8.6–10.2)
Chloride: 98 mmol/L (ref 96–106)
Creatinine, Ser: 1.27 mg/dL (ref 0.76–1.27)
Globulin, Total: 2.2 g/dL (ref 1.5–4.5)
Glucose: 175 mg/dL — ABNORMAL HIGH (ref 70–99)
Potassium: 4.3 mmol/L (ref 3.5–5.2)
Sodium: 139 mmol/L (ref 134–144)
Total Protein: 6.4 g/dL (ref 6.0–8.5)
eGFR: 61 mL/min/{1.73_m2}

## 2024-11-28 ENCOUNTER — Other Ambulatory Visit

## 2024-11-29 ENCOUNTER — Ambulatory Visit: Payer: Self-pay | Admitting: Family Medicine

## 2024-11-30 NOTE — Telephone Encounter (Signed)
 PAS read results to patient. Pt states that he ate prior to labs, should repeat BS when he comes for isoenzymes? Pt is also asking if he should still be taking magnesium.   Please contact pt to schedule labs. Patient would also like to review medications/med reconciliation. Also asking for referral/order for EGD, states missed the one previously scheduled.  Prefers call back after 12 pm.   Severa Rock HERO, FNP to [facility]    11/29/24  9:14 AM Result Note Blood glucose elevated at 175, limit intake of sugary foods and drinks.  Renal function has returned to normal.  Alk phos remains elevated, can we see if isoenzymes can be added?

## 2024-12-02 LAB — ALKALINE PHOSPHATASE ISOENZYMES
Alkaline Phosphatase: 136 [IU]/L — ABNORMAL HIGH (ref 47–123)
BONE FRACTION %:: 48 %
Bone Fraction IU/L:: 65 [IU]/L — ABNORMAL HIGH (ref 16–52)
INTESTINAL FRAC.%:: 15 %
INTESTINALFRAC.IU/L:: 20 [IU]/L — ABNORMAL HIGH (ref 0–14)
LIVER FRACTION %:: 37 %
Liver Fraction IU/L:: 50 [IU]/L (ref 21–86)

## 2024-12-02 LAB — SPECIMEN STATUS REPORT

## 2024-12-27 ENCOUNTER — Ambulatory Visit

## 2024-12-28 ENCOUNTER — Ambulatory Visit (HOSPITAL_COMMUNITY)

## 2025-05-10 ENCOUNTER — Encounter: Admitting: Family Medicine
